# Patient Record
Sex: Male | Born: 1985 | Race: White | Hispanic: No | Marital: Single | Smoking: Current every day smoker
Health system: Southern US, Community
[De-identification: ages and names within clinical notes are randomized; demographics above are authoritative.]

## PROBLEM LIST (undated history)

## (undated) DIAGNOSIS — J45909 Unspecified asthma, uncomplicated: Secondary | ICD-10-CM

## (undated) HISTORY — PX: TONSILLECTOMY: SUR1361

---

## 2007-07-17 ENCOUNTER — Ambulatory Visit: Payer: Self-pay | Admitting: Internal Medicine

## 2007-12-03 ENCOUNTER — Emergency Department: Payer: Self-pay | Admitting: Emergency Medicine

## 2008-04-07 ENCOUNTER — Emergency Department: Payer: Self-pay | Admitting: Emergency Medicine

## 2008-06-09 ENCOUNTER — Emergency Department: Payer: Self-pay | Admitting: Emergency Medicine

## 2008-06-19 ENCOUNTER — Emergency Department: Payer: Self-pay

## 2009-11-05 ENCOUNTER — Emergency Department: Payer: Self-pay

## 2010-01-10 ENCOUNTER — Emergency Department: Payer: Self-pay | Admitting: Emergency Medicine

## 2010-05-18 ENCOUNTER — Emergency Department: Payer: Self-pay | Admitting: Unknown Physician Specialty

## 2011-01-24 ENCOUNTER — Emergency Department: Payer: Self-pay | Admitting: Emergency Medicine

## 2011-02-07 ENCOUNTER — Ambulatory Visit: Payer: Self-pay | Admitting: Internal Medicine

## 2011-02-09 ENCOUNTER — Ambulatory Visit: Payer: Self-pay | Admitting: Family Medicine

## 2013-04-05 ENCOUNTER — Emergency Department: Payer: Self-pay | Admitting: Emergency Medicine

## 2013-07-20 ENCOUNTER — Emergency Department: Payer: Self-pay | Admitting: Emergency Medicine

## 2013-07-21 LAB — CBC
HCT: 44.7 % (ref 40.0–52.0)
HGB: 14.9 g/dL (ref 13.0–18.0)
MCH: 30.9 pg (ref 26.0–34.0)
MCHC: 33.4 g/dL (ref 32.0–36.0)
MCV: 93 fL (ref 80–100)

## 2013-07-21 LAB — COMPREHENSIVE METABOLIC PANEL
Albumin: 4 g/dL (ref 3.4–5.0)
Alkaline Phosphatase: 57 U/L
Anion Gap: 4 — ABNORMAL LOW (ref 7–16)
Bilirubin,Total: 0.3 mg/dL (ref 0.2–1.0)
Chloride: 106 mmol/L (ref 98–107)
Co2: 29 mmol/L (ref 21–32)
Creatinine: 1.09 mg/dL (ref 0.60–1.30)
EGFR (African American): >60
EGFR (Non-African Amer.): >60
Potassium: 3.9 mmol/L (ref 3.5–5.1)
Total Protein: 7.7 g/dL (ref 6.4–8.2)

## 2013-07-21 LAB — LIPASE, BLOOD: Lipase: 69 U/L — ABNORMAL LOW (ref 73–393)

## 2013-10-17 ENCOUNTER — Emergency Department: Payer: Self-pay | Admitting: Emergency Medicine
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
## 2017-01-12 ENCOUNTER — Emergency Department
Admission: EM | Admit: 2017-01-12 | Discharge: 2017-01-13 | Disposition: A | Discharge status: Home / Self Care | Payer: Self-pay | Attending: Emergency Medicine | Admitting: Emergency Medicine

## 2017-01-12 ENCOUNTER — Emergency Department: Payer: Self-pay

## 2017-01-12 DIAGNOSIS — Z23 Encounter for immunization: Secondary | ICD-10-CM | POA: Insufficient documentation

## 2017-01-12 DIAGNOSIS — S61309A Unspecified open wound of unspecified finger with damage to nail, initial encounter: Secondary | ICD-10-CM

## 2017-01-12 DIAGNOSIS — S61303A Unspecified open wound of left middle finger with damage to nail, initial encounter: Secondary | ICD-10-CM | POA: Insufficient documentation

## 2017-01-12 DIAGNOSIS — Y9389 Activity, other specified: Secondary | ICD-10-CM | POA: Insufficient documentation

## 2017-01-12 DIAGNOSIS — Y998 Other external cause status: Secondary | ICD-10-CM | POA: Insufficient documentation

## 2017-01-12 DIAGNOSIS — S61301A Unspecified open wound of left index finger with damage to nail, initial encounter: Secondary | ICD-10-CM | POA: Insufficient documentation

## 2017-01-12 DIAGNOSIS — Z79899 Other long term (current) drug therapy: Secondary | ICD-10-CM | POA: Insufficient documentation

## 2017-01-12 DIAGNOSIS — Y92099 Unspecified place in other non-institutional residence as the place of occurrence of the external cause: Secondary | ICD-10-CM | POA: Insufficient documentation

## 2017-01-12 DIAGNOSIS — S6992XA Unspecified injury of left wrist, hand and finger(s), initial encounter: Secondary | ICD-10-CM

## 2017-01-12 DIAGNOSIS — W230XXA Caught, crushed, jammed, or pinched between moving objects, initial encounter: Secondary | ICD-10-CM | POA: Insufficient documentation

## 2017-01-12 LAB — CBC WITH DIFFERENTIAL/PLATELET
BASOS ABS: 0.1 10*3/uL (ref 0–0.1)
Basophils Relative: 1 %
Eosinophils Absolute: 0.4 10*3/uL (ref 0–0.7)
Eosinophils Relative: 5 %
HEMATOCRIT: 46.9 % (ref 40.0–52.0)
Hemoglobin: 16 g/dL (ref 13.0–18.0)
LYMPHS PCT: 33 %
Lymphs Abs: 2.5 10*3/uL (ref 1.0–3.6)
MCH: 31.5 pg (ref 26.0–34.0)
MCHC: 34.2 g/dL (ref 32.0–36.0)
MCV: 92.2 fL (ref 80.0–100.0)
MONO ABS: 0.6 10*3/uL (ref 0.2–1.0)
Monocytes Relative: 8 %
NEUTROS ABS: 4.1 10*3/uL (ref 1.4–6.5)
Neutrophils Relative %: 53 %
PLATELETS: 244 10*3/uL (ref 150–440)
RBC: 5.09 MIL/uL (ref 4.40–5.90)
RDW: 13.2 % (ref 11.5–14.5)
WBC: 7.7 10*3/uL (ref 3.8–10.6)

## 2017-01-12 LAB — BASIC METABOLIC PANEL
ANION GAP: 8 (ref 5–15)
BUN: 13 mg/dL (ref 6–20)
CALCIUM: 9.4 mg/dL (ref 8.9–10.3)
CO2: 26 mmol/L (ref 22–32)
Chloride: 105 mmol/L (ref 101–111)
Creatinine, Ser: 1.11 mg/dL (ref 0.61–1.24)
GLUCOSE: 121 mg/dL — AB (ref 65–99)
Potassium: 3.9 mmol/L (ref 3.5–5.1)
Sodium: 139 mmol/L (ref 135–145)

## 2017-01-12 MED ORDER — LIDOCAINE HCL (PF) 1 % IJ SOLN
INTRAMUSCULAR | Status: AC
  Administered 2017-01-12: 10 mL via INTRADERMAL
  Filled 2017-01-12: qty 10

## 2017-01-12 MED ORDER — HYDROMORPHONE HCL 1 MG/ML IJ SOLN
1.0000 mg | Freq: Once | INTRAMUSCULAR | Status: AC
  Administered 2017-01-13: 1 mg via INTRAMUSCULAR
  Filled 2017-01-12: qty 1

## 2017-01-12 MED ORDER — LIDOCAINE HCL (PF) 1 % IJ SOLN
INTRAMUSCULAR | Status: AC
  Filled 2017-01-12: qty 5

## 2017-01-12 MED ORDER — OXYCODONE-ACETAMINOPHEN 5-325 MG PO TABS
1.0000 | ORAL_TABLET | Freq: Once | ORAL | Status: AC
  Administered 2017-01-12: 1 via ORAL

## 2017-01-12 MED ORDER — CEPHALEXIN 500 MG PO CAPS: 500.0000 mg | ORAL_CAPSULE | Freq: Four times a day (QID) | ORAL | 0 refills | Status: AC

## 2017-01-12 MED ORDER — HYDROMORPHONE HCL 1 MG/ML IJ SOLN
INTRAMUSCULAR | Status: AC
  Administered 2017-01-12: 1 mg
  Filled 2017-01-12: qty 1

## 2017-01-12 MED ORDER — TETANUS-DIPHTH-ACELL PERTUSSIS 5-2.5-18.5 LF-MCG/0.5 IM SUSP
0.5000 mL | Freq: Once | INTRAMUSCULAR | Status: AC
  Administered 2017-01-12: 0.5 mL via INTRAMUSCULAR
  Filled 2017-01-12: qty 0.5

## 2017-01-12 MED ORDER — LIDOCAINE HCL (PF) 1 % IJ SOLN
10.0000 mL | Freq: Once | INTRAMUSCULAR | Status: AC
  Administered 2017-01-12: 10 mL via INTRADERMAL

## 2017-01-12 MED ORDER — LIDOCAINE HCL (PF) 1 % IJ SOLN
INTRAMUSCULAR | Status: AC
  Filled 2017-01-12: qty 10

## 2017-01-12 MED ORDER — OXYCODONE-ACETAMINOPHEN 5-325 MG PO TABS
ORAL_TABLET | ORAL | Status: AC
  Administered 2017-01-12: 1 via ORAL
  Filled 2017-01-12: qty 1

## 2017-01-12 MED ORDER — HYDROMORPHONE HCL 1 MG/ML IJ SOLN
1.0000 mg | Freq: Once | INTRAMUSCULAR | Status: AC
  Administered 2017-01-12: 1 mg via INTRAVENOUS
  Filled 2017-01-12: qty 1

## 2017-01-12 MED ORDER — ONDANSETRON HCL 4 MG/2ML IJ SOLN
INTRAMUSCULAR | Status: AC
  Administered 2017-01-12: 4 mg
  Filled 2017-01-12: qty 2

## 2017-01-12 MED ORDER — LIDOCAINE HCL (PF) 1 % IJ SOLN
INTRAMUSCULAR | Status: AC
  Administered 2017-01-13: 20 mL via INTRADERMAL
  Filled 2017-01-12: qty 5

## 2017-01-12 MED ORDER — CEPHALEXIN 500 MG PO CAPS
500.0000 mg | ORAL_CAPSULE | Freq: Once | ORAL | Status: AC
  Administered 2017-01-12: 500 mg via ORAL
  Filled 2017-01-12: qty 1
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
## 2017-01-13 MED ORDER — LIDOCAINE HCL (PF) 1 % IJ SOLN
20.0000 mL | Freq: Once | INTRAMUSCULAR | Status: AC
  Administered 2017-01-13: 20 mL via INTRADERMAL

## 2017-01-13 MED ORDER — OXYCODONE-ACETAMINOPHEN 5-325 MG PO TABS
1.0000 | ORAL_TABLET | Freq: Once | ORAL | Status: AC
  Administered 2017-01-13: 1 via ORAL

## 2017-01-13 MED ORDER — BACITRACIN ZINC 500 UNIT/GM EX OINT
TOPICAL_OINTMENT | CUTANEOUS | Status: AC
  Administered 2017-01-13: 1 via TOPICAL
  Filled 2017-01-13: qty 2.7

## 2017-01-13 MED ORDER — BACITRACIN ZINC 500 UNIT/GM EX OINT
TOPICAL_OINTMENT | CUTANEOUS | Status: DC | PRN
  Administered 2017-01-13: 1 via TOPICAL

## 2017-01-13 MED ORDER — OXYCODONE-ACETAMINOPHEN 5-325 MG PO TABS
ORAL_TABLET | ORAL | Status: AC
  Administered 2017-01-13: 1 via ORAL
  Filled 2017-01-13: qty 1

## 2017-01-13 MED ORDER — OXYCODONE-ACETAMINOPHEN 5-325 MG PO TABS: 1.0000 | ORAL_TABLET | ORAL | 0 refills | Status: DC | PRN

## 2017-01-13 NOTE — ED Notes (Addendum)
Pt given visual, written and verbal instructions for wound care. Pt and pt's significant other verbalized understanding of this.  Pt's wound cleaned by this RN and Mayra, EDT with dressing applied by Penni BombardKendall RN with bacitracin per EDP order.  Pt and pt's significant other verbalizes understanding of DC instructions including medication and follow up.  Pt and significant other also verbalize understanding of signs of infection to wound with repeat back to this RN including drainage, odor, temperature, heat to wound site, increased redness around site and increased pain. Pt ambulatory at discharge and is A&O at this time. Pt discharged with significant other driving home.  Patient discharge and follow up information reviewed with patient by ED nursing staff and patient given the opportunity to ask questions pertaining to ED visit and discharge plan of care. Patient advised that should symptoms not continue to improve, resolve entirely, or should new symptoms develop then a follow up visit with their PCP or a return visit to the ED may be warranted. Patient verbalized consent and understanding of discharge plan of care including potential need for further evaluation. Patient being discharged in stable condition per attending ED physician on duty.

## 2017-01-13 NOTE — ED Provider Notes (Signed)
We were unable to find foil to cover the nail bed. Dr. Mayford KnifeWilliams recommended using Steri-Strips to place under the nail matrix. Absorbable sutures were placed into the nail bed and Steri-Strips were sutured into the nail matrix.  LACERATION REPAIR Performed by: Enid DerryAshley Jaelene Garciagarcia  Consent: Verbal consent obtained.  Consent given by: patient  Prepped and Draped in normal sterile fashion  Wound explored: No foreign bodies   Laceration Location: Index finger  Anesthesia: None  Local anesthetic: lidocaine 1% without epinephrine  Anesthetic total: 10 ml  Irrigation method: syringe  Amount of cleaning: 500ml normal saline  Skin closure: 5-0 Vicryl absorbable   Number of sutures: 7  Technique: Simple interrupted  Patient tolerance: Patient tolerated the procedure well with no immediate complications.   LACERATION REPAIR Performed by: Enid DerryAshley Baker Moronta  Consent: Verbal consent obtained.  Consent given by: patient  Prepped and Draped in normal sterile fashion  Wound explored: No foreign bodies   Laceration Location: middle finger  Anesthesia: None  Local anesthetic: lidocaine 1% without epinephrine  Anesthetic total: 10 ml  Irrigation method: syringe  Amount of cleaning: 500ml normal saline  Skin closure: 4-0 nylon and 5-0 Vicryl absorbable   Number of sutures: 12  Technique: Simple interrupted  Patient tolerance: Patient tolerated the procedure well with no immediate complications.   Enid DerryWagner, Lattie Cervi, PA-C 01/13/17 0031

## 2017-01-17 ENCOUNTER — Emergency Department
Admission: EM | Admit: 2017-01-17 | Discharge: 2017-01-17 | Disposition: A | Discharge status: Home / Self Care | Payer: Self-pay | Attending: Emergency Medicine | Admitting: Emergency Medicine

## 2017-01-17 ENCOUNTER — Encounter: Payer: Self-pay | Admitting: Emergency Medicine

## 2017-01-17 DIAGNOSIS — Z760 Encounter for issue of repeat prescription: Secondary | ICD-10-CM | POA: Insufficient documentation

## 2017-01-17 DIAGNOSIS — J45909 Unspecified asthma, uncomplicated: Secondary | ICD-10-CM | POA: Insufficient documentation

## 2017-01-17 DIAGNOSIS — Z5189 Encounter for other specified aftercare: Secondary | ICD-10-CM

## 2017-01-17 DIAGNOSIS — Z4800 Encounter for change or removal of nonsurgical wound dressing: Secondary | ICD-10-CM | POA: Insufficient documentation

## 2017-01-17 DIAGNOSIS — M79642 Pain in left hand: Secondary | ICD-10-CM | POA: Insufficient documentation

## 2017-01-17 HISTORY — DX: Unspecified asthma, uncomplicated: J45.909

## 2017-01-17 MED ORDER — OXYCODONE-ACETAMINOPHEN 5-325 MG PO TABS: 1.0000 | ORAL_TABLET | ORAL | 0 refills | Status: DC | PRN

## 2017-01-17 MED ORDER — NAPROXEN 500 MG PO TABS: 500.0000 mg | ORAL_TABLET | Freq: Two times a day (BID) | ORAL | 0 refills | Status: DC

## 2017-01-17 MED ORDER — HYDROMORPHONE HCL 1 MG/ML IJ SOLN
1.0000 mg | Freq: Once | INTRAMUSCULAR | Status: AC
  Administered 2017-01-17: 1 mg via INTRAMUSCULAR
  Filled 2017-01-17: qty 1
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
## 2017-01-17 NOTE — ED Notes (Signed)
See triage note  Wound check to finger injury  Is currently out of pain meds

## 2017-02-09 ENCOUNTER — Ambulatory Visit: Payer: Self-pay | Admitting: Surgery

## 2017-02-20 ENCOUNTER — Encounter: Payer: Self-pay | Attending: Surgery | Admitting: Surgery

## 2017-02-20 DIAGNOSIS — S61213A Laceration without foreign body of left middle finger without damage to nail, initial encounter: Secondary | ICD-10-CM | POA: Insufficient documentation

## 2017-02-20 DIAGNOSIS — J45909 Unspecified asthma, uncomplicated: Secondary | ICD-10-CM | POA: Insufficient documentation

## 2017-02-20 DIAGNOSIS — X58XXXA Exposure to other specified factors, initial encounter: Secondary | ICD-10-CM | POA: Insufficient documentation

## 2017-02-20 DIAGNOSIS — S61211A Laceration without foreign body of left index finger without damage to nail, initial encounter: Secondary | ICD-10-CM | POA: Insufficient documentation

## 2017-02-21 NOTE — Progress Notes (Signed)
Trisha MangleCRUTCHFIELD, Naftali L. ([id number]) Visit Report for 02/20/2017 Abuse/Suicide Risk Screen Details Patient Name: Roussel, Ashtin L. Date of Service: 02/20/2017 8:00 AM Medical Record Number: [id number] Patient Account Number: [id number] Date of Birth/Sex: 06/23/1986 (30 y.o. Male) Treating RN: Curtis Sitesorthy, Joanna Primary Care Vann Okerlund: PATIENT, NO Other Clinician: Referring Natilee Gauer: Phineas SemenGoodman, Graydon Treating Rachna Schonberger/Extender: Rudene ReBritto, Errol Weeks in Treatment: 0 Abuse/Suicide Risk Screen Items Answer ABUSE/SUICIDE RISK SCREEN: Has anyone close to you tried to hurt or harm you recentlyo No Do you feel uncomfortable with anyone in your familyo No Has anyone forced you do things that you didnot want to doo No Do you have any thoughts of harming yourselfo No Patient displays signs or symptoms of abuse and/or neglect. No Electronic Signature(s) Signed: 02/20/2017 4:55:34 PM By: Curtis Sitesorthy, Joanna Entered By: Curtis Sitesorthy, Joanna on 02/20/2017 08:17:25 Knippenberg, Llana AlimentJAMES L. ([id number]) -------------------------------------------------------------------------------- Activities of Daily Living Details Patient Name: Pae, Nitin L. Date of Service: 02/20/2017 8:00 AM Medical Record Number: [id number] Patient Account Number: [id number] Date of Birth/Sex: 06/05/1986 (30 y.o. Male) Treating RN: Curtis Sitesorthy, Joanna Primary Care Lovie Zarling: PATIENT, NO Other Clinician: Referring Soliana Kitko: Phineas SemenGoodman, Graydon Treating Ayah Cozzolino/Extender: Rudene ReBritto, Errol Weeks in Treatment: 0 Activities of Daily Living Items Answer Activities of Daily Living (Please select one for each item) Drive Automobile Completely Able Take Medications Completely Able Use Telephone Completely Able Care for Appearance Completely Able Use Toilet Completely Able Bath / Shower Completely Able Dress Self Completely Able Feed Self Completely Able Walk Completely Able Get In / Out Bed Completely Able Housework Completely Able Prepare Meals  Completely Able Handle Money Completely Able Shop for Self Completely Able Electronic Signature(s) Signed: 02/20/2017 4:55:34 PM By: Curtis Sitesorthy, Joanna Entered By: Curtis Sitesorthy, Joanna on 02/20/2017 08:17:42 Saintil, Llana AlimentJAMES L. ([id number]) -------------------------------------------------------------------------------- Education Assessment Details Patient Name: Zacher, Cartez L. Date of Service: 02/20/2017 8:00 AM Medical Record Number: [id number] Patient Account Number: [id number] Date of Birth/Sex: 01/08/1986 (30 y.o. Male) Treating RN: Curtis Sitesorthy, Joanna Primary Care Omie Ferger: PATIENT, NO Other Clinician: Referring Noelene Gang: Phineas SemenGoodman, Graydon Treating Flower Franko/Extender: Rudene ReBritto, Errol Weeks in Treatment: 0 Primary Learner Assessed: Patient Learning Preferences/Education Level/Primary Language Learning Preference: Explanation, Demonstration Highest Education Level: High School Preferred Language: English Cognitive Barrier Assessment/Beliefs Language Barrier: No Translator Needed: No Memory Deficit: No Emotional Barrier: No Cultural/Religious Beliefs Affecting Medical No Care: Physical Barrier Assessment Impaired Vision: No Impaired Hearing: No Decreased Hand dexterity: No Knowledge/Comprehension Assessment Knowledge Level: Medium Comprehension Level: Medium Ability to understand written Medium instructions: Ability to understand verbal Medium instructions: Motivation Assessment Anxiety Level: Calm Cooperation: Cooperative Education Importance: Acknowledges Need Interest in Health Problems: Asks Questions Perception: Coherent Willingness to Engage in Self- Medium Management Activities: Readiness to Engage in Self- Medium Management Activities: Electronic Signature(s) Trisha MangleCRUTCHFIELD, Remus L. ([id number]) Signed: 02/20/2017 4:55:34 PM By: Curtis Sitesorthy, Joanna Entered By: Curtis Sitesorthy, Joanna on 02/20/2017 08:18:04 Gamarra, Llana AlimentJAMES L.  ([id number]) -------------------------------------------------------------------------------- Fall Risk Assessment Details Patient Name: Boateng, Taimur L. Date of Service: 02/20/2017 8:00 AM Medical Record Number: [id number] Patient Account Number: [id number] Date of Birth/Sex: 07/31/1985 (30 y.o. Male) Treating RN: Curtis Sitesorthy, Joanna Primary Care Thedora Rings: PATIENT, NO Other Clinician: Referring Ellwyn Ergle: Phineas SemenGoodman, Graydon Treating Synda Bagent/Extender: Rudene ReBritto, Errol Weeks in Treatment: 0 Fall Risk Assessment Items Have you had 2 or more falls in the last 12 monthso 0 No Have you had any fall that resulted in injury in the last 12 monthso 0 No FALL RISK ASSESSMENT: History of falling - immediate or within 3 months 0 No Secondary diagnosis 0 No Ambulatory aid None/bed rest/wheelchair/nurse 0 Yes Crutches/cane/walker 0 No Furniture 0  No IV Access/Saline Lock 0 No Gait/Training Normal/bed rest/immobile 0 Yes Weak 0 No Impaired 0 No Mental Status Oriented to own ability 0 Yes Electronic Signature(s) Signed: 02/20/2017 4:55:34 PM By: Curtis Sitesorthy, Joanna Entered By: Curtis Sitesorthy, Joanna on 02/20/2017 08:18:12 Pollino, Llana AlimentJAMES L. ([id number]) -------------------------------------------------------------------------------- Nutrition Risk Assessment Details Patient Name: Kossman, Ruhaan L. Date of Service: 02/20/2017 8:00 AM Medical Record Number: [id number] Patient Account Number: [id number] Date of Birth/Sex: 03/05/1986 (30 y.o. Male) Treating RN: Curtis Sitesorthy, Joanna Primary Care Jenene Kauffmann: PATIENT, NO Other Clinician: Referring Carly Sabo: Phineas SemenGoodman, Graydon Treating Kamerin Grumbine/Extender: Rudene ReBritto, Errol Weeks in Treatment: 0 Height (in): 72 Weight (lbs): 150 Body Mass Index (BMI): 20.3 Nutrition Risk Assessment Items NUTRITION RISK SCREEN: I have an illness or condition that made me change the kind and/or 0 No amount of food I eat I eat fewer than two meals per day 0 No I eat few fruits and  vegetables, or milk products 0 No I have three or more drinks of beer, liquor or wine almost every day 0 No I have tooth or mouth problems that make it hard for me to eat 0 No I don't always have enough money to buy the food I need 0 No I eat alone most of the time 0 No I take three or more different prescribed or over-the-counter drugs a 0 No day Without wanting to, I have lost or gained 10 pounds in the last six 0 No months I am not always physically able to shop, cook and/or feed myself 0 No Nutrition Protocols Good Risk Protocol 0 No interventions needed Moderate Risk Protocol Electronic Signature(s) Signed: 02/20/2017 4:55:34 PM By: Curtis Sitesorthy, Joanna Entered By: Curtis Sitesorthy, Joanna on 02/20/2017 08:18:16

## 2017-02-21 NOTE — Progress Notes (Signed)
DESTINY, TRICKEY ([id number]) Visit Report for 02/20/2017 Allergy List Details Patient Name: [name], [name] Date of Service: 02/20/2017 8:00 AM Medical Record Number: [id number] Patient Account Number: [id number] Date of Birth/Sex: 12-15-1985 (30 y.o. Male) Treating RN: Curtis Sites Primary Care Tarek Cravens: PATIENT, NO Other Clinician: Referring Felicitas Sine: Phineas Semen Treating Danaisha Celli/Extender: Rudene Re in Treatment: 0 Allergies Active Allergies No Known Drug Allergies Allergy Notes Electronic Signature(s) Signed: 02/20/2017 4:55:34 PM By: Curtis Sites Entered By: Curtis Sites on 02/20/2017 08:15:01 [name], [name] ([id number]) -------------------------------------------------------------------------------- Arrival Information Details Patient Name: [name], [name] Date of Service: 02/20/2017 8:00 AM Medical Record Number: [id number] Patient Account Number: [id number] Date of Birth/Sex: [birth year] (30 y.o. Male) Treating RN: Curtis Sites Primary Care Jaxson Anglin: PATIENT, NO Other Clinician: Referring Jaylynn Mcaleer: Phineas Semen Treating Tabitha Riggins/Extender: Rudene Re in Treatment: 0 Visit Information Patient Arrived: Ambulatory Arrival Time: 08:12 Accompanied By: self Transfer Assistance: None Patient Identification Verified: Yes Secondary Verification Process Yes Completed: Electronic Signature(s) Signed: 02/20/2017 4:55:34 PM By: Curtis Sites Entered By: Curtis Sites on 02/20/2017 08:12:19 [name], [name] ([id number]) -------------------------------------------------------------------------------- Clinic Level of Care Assessment Details Patient Name: [name], [name] Date of Service: 02/20/2017 8:00 AM Medical Record Number: [id number] Patient Account Number: [id number] Date of Birth/Sex: 1985-12-14 (30 y.o. Male) Treating RN: Curtis Sites Primary Care Lesle Faron: PATIENT, NO Other Clinician: Referring Amish Mintzer:  Phineas Semen Treating Kyndell Zeiser/Extender: Rudene Re in Treatment: 0 Clinic Level of Care Assessment Items TOOL 1 Quantity Score []  - Use when EandM and Procedure is performed on INITIAL visit 0 ASSESSMENTS - Nursing Assessment / Reassessment X - General Physical Exam (combine w/ comprehensive assessment (listed just 1 20 below) when performed on new pt. evals) X - Comprehensive Assessment (HX, ROS, Risk Assessments, Wounds Hx, etc.) 1 25 ASSESSMENTS - Wound and Skin Assessment / Reassessment X - Dermatologic / Skin Assessment (not related to wound area) 1 10 ASSESSMENTS - Ostomy and/or Continence Assessment and Care []  - Incontinence Assessment and Management 0 []  - Ostomy Care Assessment and Management (repouching, etc.) 0 PROCESS - Coordination of Care X - Simple Patient / Family Education for ongoing care 1 15 []  - Complex (extensive) Patient / Family Education for ongoing care 0 X - Staff obtains Chiropractor, Records, Test Results / Process Orders 1 10 []  - Staff telephones HHA, Nursing Homes / Clarify orders / etc 0 []  - Routine Transfer to another Facility (non-emergent condition) 0 []  - Routine Hospital Admission (non-emergent condition) 0 X - New Admissions / Manufacturing engineer / Ordering NPWT, Apligraf, etc. 1 15 []  - Emergency Hospital Admission (emergent condition) 0 PROCESS - Special Needs []  - Pediatric / Minor Patient Management 0 []  - Isolation Patient Management 0 [name], [name] ([id number]) []  - Hearing / Language / Visual special needs 0 []  - Assessment of Community assistance (transportation, D/C planning, etc.) 0 []  - Additional assistance / Altered mentation 0 []  - Support Surface(s) Assessment (bed, cushion, seat, etc.) 0 INTERVENTIONS - Miscellaneous []  - External ear exam 0 []  - Patient Transfer (multiple staff / Nurse, adult / Similar devices) 0 []  - Simple Staple / Suture removal (25 or less) 0 []  - Complex Staple / Suture removal (26  or more) 0 []  - Hypo/Hyperglycemic Management (do not check if billed separately) 0 []  - Ankle / Brachial Index (ABI) - do not check if billed separately 0 Has the patient been seen at the hospital within the last three years: Yes Total Score: 95 Level Of Care: New/Established - Level  3 Electronic Signature(s) Signed: 02/20/2017 4:55:34 PM By: Curtis Sitesorthy, Joanna Entered By: Curtis Sitesorthy, Joanna on 02/20/2017 09:54:20 [name], [name] ([id number]) -------------------------------------------------------------------------------- Encounter Discharge Information Details Patient Name: [name], [name] Date of Service: 02/20/2017 8:00 AM Medical Record Number: [id number] Patient Account Number: [id number] Date of Birth/Sex: 06/08/1986 (30 y.o. Male) Treating RN: Curtis Sitesorthy, Joanna Primary Care Jayden Kratochvil: PATIENT, NO Other Clinician: Referring Temeka Pore: Phineas SemenGoodman, Graydon Treating Laken Rog/Extender: Rudene ReBritto, Errol Weeks in Treatment: 0 Encounter Discharge Information Items Discharge Pain Level: 0 Discharge Condition: Stable Ambulatory Status: Ambulatory Discharge Destination: Home Transportation: Private Auto Accompanied By: self Schedule Follow-up Appointment: No Medication Reconciliation completed and provided to Patient/Care No Chelby Salata: Provided on Clinical Summary of Care: 02/20/2017 Form Type Recipient Paper Patient JC Electronic Signature(s) Signed: 02/20/2017 10:33:52 AM By: Curtis Sitesorthy, Joanna Previous Signature: 02/20/2017 8:49:45 AM Version By: Gwenlyn PerkingMoore, Shelia Entered By: Curtis Sitesorthy, Joanna on 02/20/2017 10:33:52 [name], [name] ([id number]) -------------------------------------------------------------------------------- Multi Wound Chart Details Patient Name: [name], [name] Date of Service: 02/20/2017 8:00 AM Medical Record Number: [id number] Patient Account Number: [id number] Date of Birth/Sex: 09/25/1985 (30 y.o. Male) Treating RN: Curtis Sitesorthy, Joanna Primary Care Edis Huish: PATIENT,  NO Other Clinician: Referring Jaylanie Boschee: Phineas SemenGoodman, Graydon Treating Jaelan Rasheed/Extender: Rudene ReBritto, Errol Weeks in Treatment: 0 Vital Signs Height(in): 72 Pulse(bpm): 59 Weight(lbs): 150 Blood Pressure 124/77 (mmHg): Body Mass Index(BMI): 20 Temperature(F): 98.5 Respiratory Rate 18 (breaths/min): Photos: [1:No Photos] [N/A:N/A] Wound Location: [1:Left Hand - 3rd Digit] [N/A:N/A] Wounding Event: [1:Trauma] [N/A:N/A] Primary Etiology: [1:Trauma, Other] [N/A:N/A] Comorbid History: [1:Asthma] [N/A:N/A] Date Acquired: [1:01/17/2017] [N/A:N/A] Weeks of Treatment: [1:0] [N/A:N/A] Wound Status: [1:Open] [N/A:N/A] Measurements L x W x D 0.1x0.1x0.1 [N/A:N/A] (cm) Area (cm) : [1:0.008] [N/A:N/A] Volume (cm) : [1:0.001] [N/A:N/A] Classification: [1:Full Thickness Without Exposed Support Structures] [N/A:N/A] Exudate Amount: [1:Large] [N/A:N/A] Exudate Type: [1:Serous] [N/A:N/A] Exudate Color: [1:amber] [N/A:N/A] Wound Margin: [1:Flat and Intact] [N/A:N/A] Granulation Amount: [1:None Present (0%)] [N/A:N/A] Necrotic Amount: [1:Large (67-100%)] [N/A:N/A] Necrotic Tissue: [1:Eschar] [N/A:N/A] Exposed Structures: [1:Fascia: No Fat Layer (Subcutaneous Tissue) Exposed: No Tendon: No Muscle: No Joint: No Bone: No] [N/A:N/A] Epithelialization: [1:None] [N/A:N/A] Debridement: Open Wound/Selective N/A N/A ([id number]) - Selective Pre-procedure 08:41 N/A N/A Verification/Time Out Taken: Pain Control: Lidocaine 4% Topical N/A N/A Solution Tissue Debrided: Necrotic/Eschar, Other, N/A N/A Skin Level: Non-Viable Tissue N/A N/A Debridement Area (sq 0.01 N/A N/A cm): Instrument: Forceps, Scissors N/A N/A Post Debridement 0.1x0.1x0.1 N/A N/A Measurements L x W x D (cm) Post Debridement 0.001 N/A N/A Volume: (cm) Periwound Skin Texture: Excoriation: No N/A N/A Induration: No Callus: No Crepitus: No Rash: No Scarring: No Periwound Skin Maceration: No N/A N/A Moisture: Dry/Scaly:  No Periwound Skin Color: Atrophie Blanche: No N/A N/A Cyanosis: No Ecchymosis: No Erythema: No Hemosiderin Staining: No Mottled: No Pallor: No Rubor: No Tenderness on Yes N/A N/A Palpation: Wound Preparation: Ulcer Cleansing: N/A N/A Rinsed/Irrigated with Saline Topical Anesthetic Applied: Other: lidocaine 4% Procedures Performed: Debridement N/A N/A Treatment Notes Electronic Signature(s) Signed: 02/20/2017 9:10:53 AM By: Evlyn KannerBritto, Errol MD, FACS Steff[place], CumberlandJAMES L. ([id number]) Entered By: Evlyn KannerBritto, Errol on 02/20/2017 09:10:53 Celaya, [name] ([id number]) -------------------------------------------------------------------------------- Pain Assessment Details Patient Name: [name], [name] Date of Service: 02/20/2017 8:00 AM Medical Record Number: [id number] Patient Account Number: [id number] Date of Birth/Sex: 01/10/1986 (30 y.o. Male) Treating RN: Curtis Sitesorthy, Joanna Primary Care Lenny Fiumara: PATIENT, NO Other Clinician: Referring Locklan Canoy: Phineas SemenGoodman, Graydon Treating Maurice Fotheringham/Extender: Rudene ReBritto, Errol Weeks in Treatment: 0 Active Problems Location of Pain Severity and Description of Pain Patient Has Paino Yes Site Locations Pain Location: Pain in Ulcers With Dressing Change: Yes Duration of  the Pain. Constant / Intermittento Constant Pain Management and Medication Current Pain Management: Notes Topical or injectable lidocaine is offered to patient for acute pain when surgical debridement is performed. If needed, Patient is instructed to use over the counter pain medication for the following 24-48 hours after debridement. Wound care MDs do not prescribed pain medications. Patient has chronic pain or uncontrolled pain. Patient has been instructed to make an appointment with their Primary Care Physician for pain management. Electronic Signature(s) Signed: 02/20/2017 4:55:34 PM By: Curtis Sites Entered By: Curtis Sites on 02/20/2017 08:12:32 [name], [name]  ([id number]) -------------------------------------------------------------------------------- Patient/Caregiver Education Details Patient Name: [name], [name] Date of Service: 02/20/2017 8:00 AM Medical Record Number: [id number] Patient Account Number: [id number] Date of Birth/Gender: [birth year] (30 y.o. Male) Treating RN: Curtis Sites Primary Care Physician: PATIENT, NO Other Clinician: Referring Physician: Phineas Semen Treating Physician/Extender: Rudene Re in Treatment: 0 Education Assessment Education Provided To: Patient Education Topics Provided Basic Hygiene: Handouts: Other: care of newly healed ulcer sites Methods: Explain/Verbal Responses: State content correctly Electronic Signature(s) Signed: 02/20/2017 4:55:34 PM By: Curtis Sites Entered By: Curtis Sites on 02/20/2017 10:34:13 [name], [name] ([id number]) -------------------------------------------------------------------------------- Wound Assessment Details Patient Name: [name], [name] Date of Service: 02/20/2017 8:00 AM Medical Record Number: [id number] Patient Account Number: [id number] Date of Birth/Sex: 1986-04-10 (30 y.o. Male) Treating RN: Curtis Sites Primary Care Adelaido Nicklaus: PATIENT, NO Other Clinician: Referring Idrees Quam: Phineas Semen Treating Shamicka Inga/Extender: Rudene Re in Treatment: 0 Wound Status Wound Number: 1 Primary Etiology: Trauma, Other Wound Location: Left Hand - 3rd Digit Wound Status: Open Wounding Event: Trauma Comorbid History: Asthma Date Acquired: 01/17/2017 Weeks Of Treatment: 0 Clustered Wound: No Photos Photo Uploaded By: Curtis Sites on 02/20/2017 10:59:11 Wound Measurements Length: (cm) 0.1 Width: (cm) 0.1 Depth: (cm) 0.1 Area: (cm) 0.008 Volume: (cm) 0.001 % Reduction in Area: % Reduction in Volume: Epithelialization: None Tunneling: No Undermining: No Wound Description Full Thickness Without Exposed Foul  Odor After Classification: Support Structures Slough/Fibrino Wound Margin: Flat and Intact Exudate Large Amount: Exudate Type: Serous Exudate Color: amber Cleansing: No Yes Wound Bed Granulation Amount: None Present (0%) Exposed Structure Necrotic Amount: Large (67-100%) Fascia Exposed: No Necrotic Quality: Eschar Fat Layer (Subcutaneous Tissue) Exposed: No [name], Trevyon L. ([id number]) Tendon Exposed: No Muscle Exposed: No Joint Exposed: No Bone Exposed: No Periwound Skin Texture Texture Color No Abnormalities Noted: No No Abnormalities Noted: No Callus: No Atrophie Blanche: No Crepitus: No Cyanosis: No Excoriation: No Ecchymosis: No Induration: No Erythema: No Rash: No Hemosiderin Staining: No Scarring: No Mottled: No Pallor: No Moisture Rubor: No No Abnormalities Noted: No Dry / Scaly: No Temperature / Pain Maceration: No Tenderness on Palpation: Yes Wound Preparation Ulcer Cleansing: Rinsed/Irrigated with Saline Topical Anesthetic Applied: Other: lidocaine 4%, Electronic Signature(s) Signed: 02/20/2017 4:55:34 PM By: Curtis Sites Entered By: Curtis Sites on 02/20/2017 08:24:55 Knust, [name] ([id number]) -------------------------------------------------------------------------------- Vitals Details Patient Name: Pflug, Kailan L. Date of Service: 02/20/2017 8:00 AM Medical Record Number: [id number] Patient Account Number: [id number] Date of Birth/Sex: 23-Apr-1986 (30 y.o. Male) Treating RN: Curtis Sites Primary Care Caylon Saine: PATIENT, NO Other Clinician: Referring Eran Mistry: Phineas Semen Treating Ranata Laughery/Extender: Rudene Re in Treatment: 0 Vital Signs Time Taken: 08:13 Temperature (F): 98.5 Height (in): 72 Pulse (bpm): 59 Source: Measured Respiratory Rate (breaths/min): 18 Weight (lbs): 150 Blood Pressure (mmHg): 124/77 Source: Measured Reference Range: 80 - 120 mg / dl Body Mass Index (BMI):  20.3 Electronic Signature(s) Signed: 02/20/2017 4:55:34 PM By: Curtis Sites Entered ByFrancesco Sor,  Joanna on 02/20/2017 08:14:36

## 2017-02-21 NOTE — Progress Notes (Signed)
[name], [name] ([id number]) Visit Report for 02/20/2017 Chief Complaint Document Details Patient Name: [name], [name] Date of Service: 02/20/2017 8:00 AM Medical Record Number: [id number] Patient Account Number: [id number] Date of Birth/Sex: 07/13/1986 (30 y.o. Male) Treating RN: Curtis Sitesorthy, Joanna Primary Care Provider: PATIENT, NO Other Clinician: Referring Provider: Phineas SemenGoodman, Graydon Treating Provider/Extender: Rudene ReBritto, Dima Ferrufino Weeks in Treatment: 0 Information Obtained from: Patient Chief Complaint Patient presents to the wound care center with open non-healing surgical wound to the left second and third finger which have been there for 6 weeks Electronic Signature(s) Signed: 02/20/2017 9:12:11 AM By: Evlyn KannerBritto, Yaxiel Minnie MD, FACS Entered By: Evlyn KannerBritto, Tasman Zapata on 02/20/2017 09:12:11 [name], [name] ([id number]) -------------------------------------------------------------------------------- Debridement Details Patient Name: Mcnabb, [name] Date of Service: 02/20/2017 8:00 AM Medical Record Number: [id number] Patient Account Number: [id number] Date of Birth/Sex: 06/20/1986 (30 y.o. Male) Treating RN: Curtis Sitesorthy, Joanna Primary Care Provider: PATIENT, NO Other Clinician: Referring Provider: Phineas SemenGoodman, Graydon Treating Provider/Extender: Rudene ReBritto, Legacy Lacivita Weeks in Treatment: 0 Debridement Performed for Wound #1 Left Hand - 3rd Digit Assessment: Performed By: Physician Evlyn KannerBritto, Kieli Golladay, MD Debridement: Open Wound/Selective Debridement Selective Description: Pre-procedure Verification/Time Out Yes - 08:41 Taken: Start Time: 08:41 Pain Control: Lidocaine 4% Topical Solution Level: Non-Viable Tissue Total Area Debrided (L x 0.1 (cm) x 0.1 (cm) = 0.01 (cm) W): Tissue and other Non-Viable, Eschar, Other, Skin material debrided: Instrument: Forceps, Scissors Bleeding: None End Time: 08:44 Procedural Pain: 0 Post Procedural Pain: 0 Response to Treatment: Procedure was tolerated  well Post Debridement Measurements of Total Wound Length: (cm) 0.1 Width: (cm) 0.1 Depth: (cm) 0.1 Volume: (cm) 0.001 Character of Wound/Ulcer Post Improved Debridement: Post Procedure Diagnosis Same as Pre-procedure Notes the patient had sutures placed on the left middle finger which had been there for 6 weeks. There was a lot of superficial debris and necrotic skin which was sharply removed with forceps and scissors. Electronic Signature(s) Signed: 02/20/2017 9:11:51 AM By: Evlyn KannerBritto, Ayyub Krall MD, FACS [name], [name] ([id number]) Signed: 02/20/2017 4:55:34 PM By: Curtis Sitesorthy, Joanna Entered By: Evlyn KannerBritto, Dejour Vos on 02/20/2017 09:11:51 [name], [name] ([id number]) -------------------------------------------------------------------------------- HPI Details Patient Name: [name], [name] Date of Service: 02/20/2017 8:00 AM Medical Record Number: [id number] Patient Account Number: [id number] Date of Birth/Sex: 09/29/1985 (30 y.o. Male) Treating RN: Curtis Sitesorthy, Joanna Primary Care Provider: PATIENT, NO Other Clinician: Referring Provider: Phineas SemenGoodman, Graydon Treating Provider/Extender: Rudene ReBritto, Felesha Moncrieffe Weeks in Treatment: 0 History of Present Illness HPI Description: this 31 year old male had a injury to the left second and third finger on 01/12/2017 and was seen in the ER and treated appropriately. X-ray showed no fracture and sutures were placed and local care was given. The patient was initially referred to orthopedics after discussion with the on-call orthopedic surgeon but later I understand there was no appointment available as there was no bony injury involved. He was also referred to a hand surgeon but the patient did not keep this appointment. The patient has been noncompliant and has not followed up with our wound clinic he had early appointments. He comes in today with sutures intact on his left middle finger and he is unable to flex and extend his fingers appropriately. He has  no other significant medical history. Electronic Signature(s) Signed: 02/20/2017 9:13:32 AM By: Evlyn KannerBritto, Kendy Haston MD, FACS Entered By: Evlyn KannerBritto, Devarius Nelles on 02/20/2017 09:13:31 [name], [name] ([id number]) -------------------------------------------------------------------------------- Physical Exam Details Patient Name: [name], [name] Date of Service: 02/20/2017 8:00 AM Medical Record Number: [id number] Patient Account Number: [id number] Date of Birth/Sex: 08/05/1985 (30 y.o. Male) Treating RN: Curtis Sitesorthy, Joanna  Primary Care Provider: PATIENT, NO Other Clinician: Referring Provider: Phineas Semen Treating Provider/Extender: Rudene Re in Treatment: 0 Constitutional . Pulse regular. Respirations normal and unlabored. Afebrile. . Eyes Nonicteric. Reactive to light. Ears, Nose, Mouth, and Throat Lips, teeth, and gums WNL.Marland Kitchen Moist mucosa without lesions. Neck supple and nontender. No palpable supraclavicular or cervical adenopathy. Normal sized without goiter. Respiratory WNL. No retractions.. Cardiovascular Pedal Pulses WNL. No clubbing, cyanosis or edema. Gastrointestinal (GI) Abdomen without masses or tenderness.. No liver or spleen enlargement or tenderness.. Lymphatic No adneopathy. No adenopathy. No adenopathy. Musculoskeletal Adexa without tenderness or enlargement.. Digits and nails w/o clubbing, cyanosis, infection, petechiae, ischemia, or inflammatory conditions.. Integumentary (Hair, Skin) No suspicious lesions. No crepitus or fluctuance. No peri-wound warmth or erythema. No masses.Marland Kitchen Psychiatric Judgement and insight Intact.. No evidence of depression, anxiety, or agitation.. Notes the tips of the left index and middle finger were involved with trauma about 6 weeks ago. Debris was removed sharply and there are no open wounds. 2-0 nylon sutures were incidental and these have been removed today. After careful observation and examination there are no open  wounds. Electronic Signature(s) Signed: 02/20/2017 9:14:20 AM By: Evlyn Kanner MD, FACS Entered By: Evlyn Kanner on 02/20/2017 09:14:18 Drewes, [name] ([id number]) -------------------------------------------------------------------------------- Physician Orders Details Patient Name: Daubenspeck, [name] Date of Service: 02/20/2017 8:00 AM Medical Record Number: [id number] Patient Account Number: [id number] Date of Birth/Sex: 14-Nov-1985 (30 y.o. Male) Treating RN: Curtis Sites Primary Care Provider: PATIENT, NO Other Clinician: Referring Provider: Phineas Semen Treating Provider/Extender: Rudene Re in Treatment: 0 Verbal / Phone Orders: No Diagnosis Coding Discharge From [facility] o Discharge from Wound Care Center Electronic Signature(s) Signed: 02/20/2017 4:12:07 PM By: Evlyn Kanner MD, FACS Signed: 02/20/2017 4:55:34 PM By: Curtis Sites Entered By: Curtis Sites on 02/20/2017 08:46:12 Barrientes, [name] ([id number]) -------------------------------------------------------------------------------- Problem List Details Patient Name: Pe, [name] Date of Service: 02/20/2017 8:00 AM Medical Record Number: [id number] Patient Account Number: [id number] Date of Birth/Sex: 01/28/1986 (30 y.o. Male) Treating RN: Curtis Sites Primary Care Provider: PATIENT, NO Other Clinician: Referring Provider: Phineas Semen Treating Provider/Extender: Rudene Re in Treatment: 0 Active Problems ICD-10 Encounter Code Description Active Date Diagnosis S61.211A Laceration without foreign body of left index finger without 02/20/2017 Yes damage to nail, initial encounter S61.213A Laceration without foreign body of left middle finger 02/20/2017 Yes without damage to nail, initial encounter Inactive Problems Resolved Problems Electronic Signature(s) Signed: 02/20/2017 9:10:49 AM By: Evlyn Kanner MD, FACS Entered By: Evlyn Kanner on 02/20/2017  09:10:49 Klingensmith, [name] ([id number]) -------------------------------------------------------------------------------- Progress Note Details Patient Name: Christopoulos, [name] Date of Service: 02/20/2017 8:00 AM Medical Record Number: [id number] Patient Account Number: [id number] Date of Birth/Sex: [birth year] (30 y.o. Male) Treating RN: Curtis Sites Primary Care Provider: PATIENT, NO Other Clinician: Referring Provider: Phineas Semen Treating Provider/Extender: Rudene Re in Treatment: 0 Subjective Chief Complaint Information obtained from Patient Patient presents to the wound care center with open non-healing surgical wound to the left second and third finger which have been there for 6 weeks History of Present Illness (HPI) this 31 year old male had a injury to the left second and third finger on 01/12/2017 and was seen in the ER and treated appropriately. X-ray showed no fracture and sutures were placed and local care was given. The patient was initially referred to orthopedics after discussion with the on-call orthopedic surgeon but later I understand there was no appointment available as there was no bony injury involved. He was also referred to  a hand surgeon but the patient did not keep this appointment. The patient has been noncompliant and has not followed up with our wound clinic he had early appointments. He comes in today with sutures intact on his left middle finger and he is unable to flex and extend his fingers appropriately. He has no other significant medical history. Wound History Patient presents with 2 open wounds that have been present for approximately June 26. Patient has been treating wounds in the following manner: neosporin and bandage. Laboratory tests have not been performed in the last month. Patient reportedly has not tested positive for an antibiotic resistant organism. Patient reportedly has not tested positive for osteomyelitis.  Patient reportedly has not had testing performed to evaluate circulation in the legs. Patient History Information obtained from Patient. Allergies No Known Drug Allergies Social History Never smoker, Marital Status - Single, Alcohol Use - Rarely, Drug Use - No History, Caffeine Use - Moderate. Medical History Respiratory Patient has history of Asthma Mccleave, [name] ([id number]) Review of Systems (ROS) Constitutional Symptoms (General Health) The patient has no complaints or symptoms. Eyes The patient has no complaints or symptoms. Ear/Nose/Mouth/Throat The patient has no complaints or symptoms. Hematologic/Lymphatic The patient has no complaints or symptoms. Respiratory The patient has no complaints or symptoms. Cardiovascular The patient has no complaints or symptoms. Gastrointestinal The patient has no complaints or symptoms. Endocrine The patient has no complaints or symptoms. Genitourinary The patient has no complaints or symptoms. Immunological The patient has no complaints or symptoms. Integumentary (Skin) The patient has no complaints or symptoms. Musculoskeletal The patient has no complaints or symptoms. Neurologic The patient has no complaints or symptoms. Oncologic The patient has no complaints or symptoms. Psychiatric The patient has no complaints or symptoms. Medications: Nil Objective Constitutional Pulse regular. Respirations normal and unlabored. Afebrile. Vitals Time Taken: 8:13 AM, Height: 72 in, Source: Measured, Weight: 150 lbs, Source: Measured, BMI: 20.3, Temperature: 98.5 F, Pulse: 59 bpm, Respiratory Rate: 18 breaths/min, Blood Pressure: 124/77 Kuk, [name] ([id number]) mmHg. Eyes Nonicteric. Reactive to light. Ears, Nose, Mouth, and Throat Lips, teeth, and gums WNL.Marland Kitchen Moist mucosa without lesions. Neck supple and nontender. No palpable supraclavicular or cervical adenopathy. Normal sized without goiter. Respiratory WNL.  No retractions.. Cardiovascular Pedal Pulses WNL. No clubbing, cyanosis or edema. Gastrointestinal (GI) Abdomen without masses or tenderness.. No liver or spleen enlargement or tenderness.. Lymphatic No adneopathy. No adenopathy. No adenopathy. Musculoskeletal Adexa without tenderness or enlargement.. Digits and nails w/o clubbing, cyanosis, infection, petechiae, ischemia, or inflammatory conditions.Marland Kitchen Psychiatric Judgement and insight Intact.. No evidence of depression, anxiety, or agitation.. General Notes: the tips of the left index and middle finger were involved with trauma about 6 weeks ago. Debris was removed sharply and there are no open wounds. 2-0 nylon sutures were incidental and these have been removed today. After careful observation and examination there are no open wounds. Integumentary (Hair, Skin) No suspicious lesions. No crepitus or fluctuance. No peri-wound warmth or erythema. No masses.. Wound #1 status is Open. Original cause of wound was Trauma. The wound is located on the Left Hand - 3rd Digit. The wound measures 0.1cm length x 0.1cm width x 0.1cm depth; 0.008cm^2 area and 0.001cm^3 volume. There is no tunneling or undermining noted. There is a large amount of serous drainage noted. The wound margin is flat and intact. There is no granulation within the wound bed. There is a large (67-100%) amount of necrotic tissue within the wound bed including Eschar. The periwound skin  appearance did not exhibit: Callus, Crepitus, Excoriation, Induration, Rash, Scarring, Dry/Scaly, Maceration, Atrophie Blanche, Cyanosis, Ecchymosis, Hemosiderin Staining, Mottled, Pallor, Rubor, Erythema. The periwound has tenderness on palpation. RYLEND, PIETRZAK ([id number]) Assessment Active Problems ICD-10 [id number] - Laceration without foreign body of left index finger without damage to nail, initial encounter J47.829F - Laceration without foreign body of left middle finger without  damage to nail, initial encounter this 31 year old patient who had a lacerated wound to the tip of his left hand was treated 6 weeks ago in the ER. He has failed to follow-up appropriately for suture removal and further care and has arrived today with intact sutures and a lot of debris on his wounds. After removing this carefully and removing his sutures there are no open wounds. I have recommended he follow-up with his PCP and possibly get an appointment to see a hand surgeon in case he needs rehabilitation. He understands that there are no open wounds and will not need to follow-up with the wound clinic here. Procedures Wound #1 Pre-procedure diagnosis of Wound #1 is a Trauma, Other located on the Left Hand - 3rd Digit . There was a Non-Viable Tissue Open Wound/Selective [phone]) debridement with total area of 0.01 sq cm performed by Evlyn Kanner, MD. with the following instrument(s): Forceps and Scissors to remove Non-Viable tissue/material including Eschar, Other, and Skin after achieving pain control using Lidocaine 4% Topical Solution. A time out was conducted at 08:41, prior to the start of the procedure. There was no bleeding. The procedure was tolerated well with a pain level of 0 throughout and a pain level of 0 following the procedure. Post Debridement Measurements: 0.1cm length x 0.1cm width x 0.1cm depth; 0.001cm^3 volume. Character of Wound/Ulcer Post Debridement is improved. Post procedure Diagnosis Wound #1: Same as Pre-Procedure General Notes: the patient had sutures placed on the left middle finger which had been there for 6 weeks. There was a lot of superficial debris and necrotic skin which was sharply removed with forceps and scissors.. Plan Discharge From [facility]: Discharge from [facility] Safranek, [name] ([id number]) this 31 year old patient who had a lacerated wound to the tip of his left hand was treated 6 weeks ago in the ER. He has failed  to follow-up appropriately for suture removal and further care and has arrived today with intact sutures and a lot of debris on his wounds. After removing this carefully and removing his sutures there are no open wounds. I have recommended he follow-up with his PCP and possibly get an appointment to see a hand surgeon in case he needs rehabilitation. He understands that there are no open wounds and will not need to follow-up with the wound clinic here. Electronic Signature(s) Signed: 02/20/2017 9:16:48 AM By: Evlyn Kanner MD, FACS Entered By: Evlyn Kanner on 02/20/2017 09:16:48 Housley, [name] ([id number]) -------------------------------------------------------------------------------- ROS/PFSH Details Patient Name: Beezley, Arieh L. Date of Service: 02/20/2017 8:00 AM Medical Record Number: [id number] Patient Account Number: [id number] Date of Birth/Sex: [birth year] (30 y.o. Male) Treating RN: Curtis Sites Primary Care Provider: PATIENT, NO Other Clinician: Referring Provider: Phineas Semen Treating Provider/Extender: Rudene Re in Treatment: 0 Information Obtained From Patient Wound History Do you currently have one or more open woundso Yes How many open wounds do you currently haveo 2 Approximately how long have you had your woundso June 26 How have you been treating your wound(s) until nowo neosporin and bandage Has your wound(s) ever healed and then re-openedo No Have you had any lab  work done in the past montho No Have you tested positive for an antibiotic resistant organism (MRSA, VRE)o No Have you tested positive for osteomyelitis (bone infection)o No Have you had any tests for circulation on your legso No Constitutional Symptoms (General Health) Complaints and Symptoms: No Complaints or Symptoms Eyes Complaints and Symptoms: No Complaints or Symptoms Ear/Nose/Mouth/Throat Complaints and Symptoms: No Complaints or  Symptoms Hematologic/Lymphatic Complaints and Symptoms: No Complaints or Symptoms Respiratory Complaints and Symptoms: No Complaints or Symptoms Medical History: Positive for: Asthma Cardiovascular Witherell, Kevon L. ([id number]) Complaints and Symptoms: No Complaints or Symptoms Gastrointestinal Complaints and Symptoms: No Complaints or Symptoms Endocrine Complaints and Symptoms: No Complaints or Symptoms Genitourinary Complaints and Symptoms: No Complaints or Symptoms Immunological Complaints and Symptoms: No Complaints or Symptoms Integumentary (Skin) Complaints and Symptoms: No Complaints or Symptoms Musculoskeletal Complaints and Symptoms: No Complaints or Symptoms Neurologic Complaints and Symptoms: No Complaints or Symptoms Oncologic Complaints and Symptoms: No Complaints or Symptoms Psychiatric Complaints and Symptoms: No Complaints or Symptoms Immunizations Pneumococcal Vaccine: Received Pneumococcal Vaccination: No Pizano, Gary Elbert EwingsL. ([id number]) Family and Social History Never smoker; Marital Status - Single; Alcohol Use: Rarely; Drug Use: No History; Caffeine Use: Moderate; Financial Concerns: No; Food, Clothing or Shelter Needs: No; Support System Lacking: No; Transportation Concerns: No; Advanced Directives: No; Patient does not want information on Advanced Directives Physician Affirmation I have reviewed and agree with the above information. Electronic Signature(s) Signed: 02/20/2017 4:12:07 PM By: Evlyn KannerBritto, Alexus Michael MD, FACS Signed: 02/20/2017 4:55:34 PM By: Curtis Sitesorthy, Joanna Entered By: Evlyn KannerBritto, Ardenia Stiner on 02/20/2017 09:09:27 Riebel, [name] ([id number]) -------------------------------------------------------------------------------- SuperBill Details Patient Name: Pralle, [name] Date of Service: 02/20/2017 Medical Record Number: [id number] Patient Account Number: [id number] Date of Birth/Sex: 07/08/1986 (30 y.o. Male) Treating RN: Curtis Sitesorthy,  Joanna Primary Care Provider: PATIENT, NO Other Clinician: Referring Provider: Phineas SemenGoodman, Graydon Treating Provider/Extender: Rudene ReBritto, Maceo Hernan Weeks in Treatment: 0 Diagnosis Coding ICD-10 Codes Code Description Laceration without foreign body of left index finger without damage to nail, initial S61.211A encounter Laceration without foreign body of left middle finger without damage to nail, initial S61.213A encounter Facility Procedures CPT4: Description Modifier Quantity Code [id number] 99213 - WOUND CARE VISIT-LEV 3 EST PT 1 CPT4: [id number] 97597 - DEBRIDE WOUND 1ST 20 SQ CM OR < 1 ICD-10 Description Diagnosis S61.211A Laceration without foreign body of left index finger without damage to nail, initial encounter S61.213A Laceration without foreign body of left middle finger  without damage to nail, initial encounter Physician Procedures CPT4: Description Modifier Quantity Code [id number] 99204 - WC PHYS LEVEL 4 - NEW PT 25 1 ICD-10 Description Diagnosis S61.211A Laceration without foreign body of left index finger without damage to nail, initial encounter S61.213A Laceration without  foreign body of left middle finger without damage to nail, initial encounter CPT4: [id number] 97597 - WC PHYS DEBR WO ANESTH 20 SQ CM 1 ICD-10 Description Diagnosis S61.211A Laceration without foreign body of left index finger without damage to nail, initial encounter Kemple, [name] ([id number]) Electronic Signature(s) Signed: 02/20/2017 9:54:38 AM By: Curtis Sitesorthy, Joanna Signed: 02/20/2017 4:12:07 PM By: Evlyn KannerBritto, Shaquna Geigle MD, FACS Previous Signature: 02/20/2017 9:17:20 AM Version By: Evlyn KannerBritto, Annalei Friesz MD, FACS Previous Signature: 02/20/2017 9:17:02 AM Version By: Evlyn KannerBritto, Nazeer Romney MD, FACS Entered By: Curtis Sitesorthy, Joanna on 02/20/2017 09:54:37

## 2018-02-01 ENCOUNTER — Encounter: Payer: Self-pay | Admitting: Emergency Medicine

## 2018-02-01 ENCOUNTER — Emergency Department
Admission: EM | Admit: 2018-02-01 | Discharge: 2018-02-01 | Disposition: A | Discharge status: Home / Self Care | Payer: Self-pay | Attending: Emergency Medicine | Admitting: Emergency Medicine

## 2018-02-01 ENCOUNTER — Other Ambulatory Visit: Payer: Self-pay

## 2018-02-01 DIAGNOSIS — F1010 Alcohol abuse, uncomplicated: Secondary | ICD-10-CM

## 2018-02-01 DIAGNOSIS — F10920 Alcohol use, unspecified with intoxication, uncomplicated: Secondary | ICD-10-CM | POA: Insufficient documentation

## 2018-02-01 DIAGNOSIS — F321 Major depressive disorder, single episode, moderate: Secondary | ICD-10-CM | POA: Insufficient documentation

## 2018-02-01 DIAGNOSIS — J45909 Unspecified asthma, uncomplicated: Secondary | ICD-10-CM | POA: Insufficient documentation

## 2018-02-01 DIAGNOSIS — F1994 Other psychoactive substance use, unspecified with psychoactive substance-induced mood disorder: Secondary | ICD-10-CM

## 2018-02-01 DIAGNOSIS — R45851 Suicidal ideations: Secondary | ICD-10-CM | POA: Insufficient documentation

## 2018-02-01 DIAGNOSIS — F4325 Adjustment disorder with mixed disturbance of emotions and conduct: Secondary | ICD-10-CM

## 2018-02-01 DIAGNOSIS — Y906 Blood alcohol level of 120-199 mg/100 ml: Secondary | ICD-10-CM | POA: Insufficient documentation

## 2018-02-01 LAB — CBC WITH DIFFERENTIAL/PLATELET
BASOS PCT: 0 %
Basophils Absolute: 0 10*3/uL (ref 0–0.1)
Eosinophils Absolute: 0.1 10*3/uL (ref 0–0.7)
Eosinophils Relative: 2 %
HEMATOCRIT: 45.2 % (ref 40.0–52.0)
HEMOGLOBIN: 15.3 g/dL (ref 13.0–18.0)
LYMPHS ABS: 1.4 10*3/uL (ref 1.0–3.6)
Lymphocytes Relative: 19 %
MCH: 31.9 pg (ref 26.0–34.0)
MCHC: 33.9 g/dL (ref 32.0–36.0)
MCV: 93.9 fL (ref 80.0–100.0)
MONOS PCT: 4 %
Monocytes Absolute: 0.3 10*3/uL (ref 0.2–1.0)
NEUTROS PCT: 75 %
Neutro Abs: 5.5 10*3/uL (ref 1.4–6.5)
Platelets: 253 10*3/uL (ref 150–440)
RBC: 4.81 MIL/uL (ref 4.40–5.90)
RDW: 13.7 % (ref 11.5–14.5)
WBC: 7.3 10*3/uL (ref 3.8–10.6)

## 2018-02-01 LAB — COMPREHENSIVE METABOLIC PANEL
ALBUMIN: 4.6 g/dL (ref 3.5–5.0)
ALT: 15 U/L (ref 0–44)
ANION GAP: 8 (ref 5–15)
AST: 18 U/L (ref 15–41)
Alkaline Phosphatase: 53 U/L (ref 38–126)
BUN: 13 mg/dL (ref 6–20)
CHLORIDE: 107 mmol/L (ref 98–111)
CO2: 28 mmol/L (ref 22–32)
Calcium: 9 mg/dL (ref 8.9–10.3)
Creatinine, Ser: 1.07 mg/dL (ref 0.61–1.24)
GFR calc Af Amer: >60 mL/min (ref >60)
GFR calc non Af Amer: >60 mL/min (ref >60)
GLUCOSE: 98 mg/dL (ref 70–99)
Potassium: 3.8 mmol/L (ref 3.5–5.1)
SODIUM: 143 mmol/L (ref 135–145)
TOTAL PROTEIN: 7.9 g/dL (ref 6.5–8.1)
Total Bilirubin: 0.6 mg/dL (ref 0.3–1.2)

## 2018-02-01 LAB — SALICYLATE LEVEL: Salicylate Lvl: <7 mg/dL (ref 2.8–30.0)

## 2018-02-01 LAB — ETHANOL: Alcohol, Ethyl (B): 152 mg/dL — ABNORMAL HIGH (ref <10)

## 2018-02-01 LAB — ACETAMINOPHEN LEVEL

## 2018-02-01 MED ORDER — LORAZEPAM 1 MG PO TABS
1.0000 mg | ORAL_TABLET | Freq: Once | ORAL | Status: AC
  Administered 2018-02-01: 1 mg via ORAL
  Filled 2018-02-01: qty 1

## 2018-02-01 NOTE — ED Notes (Signed)
Pt anxious and states he is ready to be discharged so he can go home to his children. RN explained he needed to speak with psychiatrist. Pt understanding. Maintained on 15 minute checks and observation by security camera for safety.

## 2018-02-01 NOTE — Discharge Instructions (Signed)
It was a pleasure to take care of you today, and thank you for coming to our emergency department.  If you have any questions or concerns before leaving please ask the nurse to grab me and I'm more than happy to go through your aftercare instructions again.  If you were prescribed any opioid pain medication today such as Norco, Vicodin, Percocet, morphine, hydrocodone, or oxycodone please make sure you do not drive when you are taking this medication as it can alter your ability to drive safely.  If you have any concerns once you are home that you are not improving or are in fact getting worse before you can make it to your follow-up appointment, please do not hesitate to call 911 and come back for further evaluation.  Merrily BrittleNeil Noach Calvillo, MD  Results for orders placed or performed during the hospital encounter of 02/01/18  Comprehensive metabolic panel  Result Value Ref Range   Sodium 143 135 - 145 mmol/L   Potassium 3.8 3.5 - 5.1 mmol/L   Chloride 107 98 - 111 mmol/L   CO2 28 22 - 32 mmol/L   Glucose, Bld 98 70 - 99 mg/dL   BUN 13 6 - 20 mg/dL   Creatinine, Ser 1.611.07 0.61 - 1.24 mg/dL   Calcium 9.0 8.9 - 09.610.3 mg/dL   Total Protein 7.9 6.5 - 8.1 g/dL   Albumin 4.6 3.5 - 5.0 g/dL   AST 18 15 - 41 U/L   ALT 15 0 - 44 U/L   Alkaline Phosphatase 53 38 - 126 U/L   Total Bilirubin 0.6 0.3 - 1.2 mg/dL   GFR calc non Af Amer >60 >60 mL/min   GFR calc Af Amer >60 >60 mL/min   Anion gap 8 5 - 15  Ethanol  Result Value Ref Range   Alcohol, Ethyl (B) 152 (H) <10 mg/dL  CBC with Diff  Result Value Ref Range   WBC 7.3 3.8 - 10.6 K/uL   RBC 4.81 4.40 - 5.90 MIL/uL   Hemoglobin 15.3 13.0 - 18.0 g/dL   HCT 04.545.2 40.940.0 - 81.152.0 %   MCV 93.9 80.0 - 100.0 fL   MCH 31.9 26.0 - 34.0 pg   MCHC 33.9 32.0 - 36.0 g/dL   RDW 91.413.7 78.211.5 - 95.614.5 %   Platelets 253 150 - 440 K/uL   Neutrophils Relative % 75 %   Neutro Abs 5.5 1.4 - 6.5 K/uL   Lymphocytes Relative 19 %   Lymphs Abs 1.4 1.0 - 3.6 K/uL   Monocytes  Relative 4 %   Monocytes Absolute 0.3 0.2 - 1.0 K/uL   Eosinophils Relative 2 %   Eosinophils Absolute 0.1 0 - 0.7 K/uL   Basophils Relative 0 %   Basophils Absolute 0.0 0 - 0.1 K/uL  Salicylate level  Result Value Ref Range   Salicylate Lvl <7.0 2.8 - 30.0 mg/dL  Acetaminophen level  Result Value Ref Range   Acetaminophen (Tylenol), Serum <10 (L) 10 - 30 ug/mL

## 2018-02-01 NOTE — ED Notes (Signed)
Pt to the er for suicidal thoughts. Pt states that his girlfriend left home and he spent the day looking for her. Pt denies any fights at home. Reports father is stopping chemo treatment for lung cancer and his girlfriend is on drugs.

## 2018-02-01 NOTE — ED Notes (Signed)
Pt reports to this Rn that he has to be in court in the morning. Advised we can let him use the phone in the morning to call the courthouse.

## 2018-02-01 NOTE — ED Provider Notes (Signed)
[facility] Emergency Department Provider Note   ____________________________________________   First MD Initiated Contact with Patient 02/01/18 [phone]     (approximate)  I have reviewed the triage vital signs and the nursing notes.   HISTORY  Chief Complaint Psychiatric Evaluation    HPI [name] is a 32 y.o. male brought to the ED by police under IVC for depression with suicidal ideation.  Reportedly patient's girlfriend recently left him and he found out that his dad has stopped cancer treatments.  Having to take care of his 2 young children, who are currently with his mother.  Patient was found walking [place] the railroad tracks and endorses suicidal thoughts.  Denies HI/AH/VH.  No prior psychiatric history.  Voices no medical complaints.   Past Medical History:  Diagnosis Date  . Asthma     There are no active problems to display for this patient.   Past Surgical History:  Procedure Laterality Date  . TONSILLECTOMY      Prior to Admission medications   Medication Sig Start Date End Date Taking? Authorizing Provider  Buprenorphine HCl-Naloxone HCl (SUBOXONE) 8-2 MG FILM Place 1 Film under the tongue as needed.    [provider]    Allergies Patient has no known allergies.  History reviewed. No pertinent family history.  Social History Social History   Tobacco Use  . Smoking status: Never Smoker  . Smokeless tobacco: Never Used  Substance Use Topics  . Alcohol use: Yes    Alcohol/week: 7.8 oz    Types: 13 Cans of beer per week  . Drug use: Yes    Types: Cocaine    Review of Systems  Constitutional: No fever/chills Eyes: No visual changes. ENT: No sore throat. Cardiovascular: Denies chest pain. Respiratory: Denies shortness of breath. Gastrointestinal: No abdominal pain.  No nausea, no vomiting.  No diarrhea.  No constipation. Genitourinary: Negative for dysuria. Musculoskeletal: Negative for back  pain. Skin: Negative for rash. Neurological: Negative for headaches, focal weakness or numbness. Psychiatric:Positive for depression with SI.  ____________________________________________   PHYSICAL EXAM:  VITAL SIGNS: ED Triage Vitals  Enc Vitals Group     BP 02/01/18 0400 (!) 135/99     Pulse Rate 02/01/18 0400 64     Resp 02/01/18 0400 20     Temp 02/01/18 0400 98.4 F (36.9 C)     Temp src --      SpO2 02/01/18 0400 100 %     Weight 02/01/18 0401 150 lb (68 kg)     Height 02/01/18 0401 6' (1.829 m)     Head Circumference --      Peak Flow --      Pain Score 02/01/18 0401 0     Pain Loc --      Pain Edu? --      Excl. in GC? --     Constitutional: Alert and oriented. Well appearing and in mild acute distress.  Tearful. Eyes: Conjunctivae are normal. PERRL. EOMI. Head: Atraumatic. Nose: No congestion/rhinnorhea. Mouth/Throat: Mucous membranes are moist.  Oropharynx non-erythematous. Neck: No stridor.   Cardiovascular: Normal rate, regular rhythm. Grossly normal heart sounds.  Good peripheral circulation. Respiratory: Normal respiratory effort.  No retractions. Lungs CTAB. Gastrointestinal: Soft and nontender. No distention. No abdominal bruits. No CVA tenderness. Musculoskeletal: No lower extremity tenderness nor edema.  No joint effusions. Neurologic:  Normal speech and language. No gross focal neurologic deficits are appreciated. No gait instability. Skin:  Skin is warm, dry  and intact. No rash noted. Psychiatric: Mood and affect are tearful, flat. Speech and behavior are normal.  ____________________________________________   LABS (all labs ordered are listed, but only abnormal results are displayed)  Labs Reviewed  ETHANOL - Abnormal; Notable for the following components:      Result Value   Alcohol, Ethyl (B) 152 (*)    All other components within normal limits  ACETAMINOPHEN LEVEL - Abnormal; Notable for the following components:   Acetaminophen  (Tylenol), Serum <10 (*)    All other components within normal limits  COMPREHENSIVE METABOLIC PANEL  CBC WITH DIFFERENTIAL/PLATELET  SALICYLATE LEVEL  URINE DRUG SCREEN, QUALITATIVE ([facility] ONLY)   ____________________________________________  EKG  None ____________________________________________  RADIOLOGY  ED MD interpretation: None  Official radiology report(s): No results found.  ____________________________________________   PROCEDURES  Procedure(s) performed: None  Procedures  Critical Care performed: No  ____________________________________________   INITIAL IMPRESSION / ASSESSMENT AND PLAN / ED COURSE  As part of my medical decision making, I reviewed the following data within the electronic MEDICAL RECORD NUMBER Nursing notes reviewed and incorporated, Labs reviewed, Old chart reviewed, A consult was requested and obtained from this/these consultant(s) Psychiatry and Notes from prior ED visits   32 year old male who presents under IVC for depression with suicidal ideation.  Laboratory results notable for elevated EtOH.  Ativan given for anxiolysis.  Will maintain patient's IVC pending psychiatric evaluation.  Patient is medically cleared for psychiatric disposition.      ____________________________________________   FINAL CLINICAL IMPRESSION(S) / ED DIAGNOSES  Final diagnoses:  Current moderate episode of major depressive disorder without prior episode (HCC)  Alcoholic intoxication without complication [facility])     ED Discharge Orders    None       Note:  This document was prepared using Dragon voice recognition software and may include unintentional dictation errors.    Irean Hong, MD 02/01/18 [phone]

## 2018-02-01 NOTE — Consult Note (Addendum)
[facility] Psychiatry Consult   Reason for Consult: Consult for 32 year old man brought to the hospital by law enforcement after being found drunk walking [place] the [place] tracks Referring Physician: Rifenbark Patient Identification: [name] MRN:  [id number] Principal Diagnosis: Adjustment disorder with mixed disturbance of emotions and conduct Diagnosis:   Patient Active Problem List   Diagnosis Date Noted  . Adjustment disorder with mixed disturbance of emotions and conduct [F43.25] 02/01/2018  . Alcohol abuse [F10.10] 02/01/2018  . Substance induced mood disorder [facility]) [F19.94] 02/01/2018    Total Time spent with patient: 1 hour  Subjective:   [name] is a 32 y.o. male patient admitted with "I have had a lot of stress".  HPI: Patient interviewed chart reviewed.  32 year old man brought to the emergency room last night after police found him wandering along the railroad tracks intoxicated.  Patient made comments to the police about having suicidal thoughts about standing in front of a train.  Patient was intoxicated when he got to the emergency room and has been intermittently agitated today about being here at all but was calm and appropriate talking with me.  He tells me that yesterday morning his long-term girlfriend, with whom he has 3 young children, left the home and seems to have left a message that she is leaving for good.  This was sort of the last straw for our patient who already is under a lot of stress because his father is dying of cancer.  Patient says his sleep is chronically poor.  Appetite not so great.  Denies having had suicidal thoughts in the past.  Says that yesterday even after having suicidal thoughts he watched trains past by and did not jump in front of them.  Today has no suicidal thought at all.  Denies psychotic symptoms.  Admits that he occasionally uses cocaine but says he was not using any yesterday.  Not receiving any mental  health treatment.  Social history: Patient is together with a long-term girlfriend with whom he has 3 children.  He believes that she left him yesterday permanently although it sounds like his evidence is a little sketchy.  He works doing long hours of heavy menial labor.  Medical history: Mild asthma that does not bother him at all as an adult.  Substance abuse history: Long-standing issues with alcohol abuse.  Drinks every day.  Says that most drinks is only about the equivalent of 1 or 2 beers but occasionally it will become a problem.  No history of DTs or seizures.  Does admit that he also uses cocaine at times  Past Psychiatric History: No history of mental health treatment at all no hospitalization no medication no meetings with mental health providers  Risk to Self: Suicidal Ideation: No Suicidal Intent: No Is patient at risk for suicide?: No Suicidal Plan?: No Access to Means: No What has been your use of drugs/alcohol within the last 12 months?: ETOH  How many times?: 0 Other Self Harm Risks: Ongoing SA use Triggers for Past Attempts: Other (Comment)(None reported ) Intentional Self Injurious Behavior: None Risk to Others: Homicidal Ideation: No Thoughts of Harm to Others: No Current Homicidal Intent: No Current Homicidal Plan: No Access to Homicidal Means: No Identified Victim: None reported History of harm to others?: No Assessment of Violence: None Noted Violent Behavior Description: None reported Does patient have access to weapons?: No Criminal Charges Pending?: Yes Describe Pending Criminal Charges: expired registration tag(speeding, reckless driving to endanger, ) Does  patient have a court date: Yes Court Date: 02/01/18(02/15/2018, 03/01/2018) Prior Inpatient Therapy: Prior Inpatient Therapy: No Prior Outpatient Therapy: Prior Outpatient Therapy: No Does patient have an ACCT team?: No Does patient have Intensive In-House Services?  : No Does patient have [facility]  services? : No Does patient have P4CC services?: No  Past Medical History:  Past Medical History:  Diagnosis Date  . Asthma     Past Surgical History:  Procedure Laterality Date  . TONSILLECTOMY     Family History: History reviewed. No pertinent family history. Family Psychiatric  History: Does not know of any Social History:  Social History   Substance and Sexual Activity  Alcohol Use Yes  . Alcohol/week: 7.8 oz  . Types: 13 Cans of beer per week     Social History   Substance and Sexual Activity  Drug Use Yes  . Types: Cocaine    Social History   Socioeconomic History  . Marital status: Single    Spouse name: Not on file  . Number of children: Not on file  . Years of education: Not on file  . Highest education level: Not on file  Occupational History  . Not on file  Social Needs  . Financial resource strain: Not on file  . Food insecurity:    Worry: Not on file    Inability: Not on file  . Transportation needs:    Medical: Not on file    Non-medical: Not on file  Tobacco Use  . Smoking status: Never Smoker  . Smokeless tobacco: Never Used  Substance and Sexual Activity  . Alcohol use: Yes    Alcohol/week: 7.8 oz    Types: 13 Cans of beer per week  . Drug use: Yes    Types: Cocaine  . Sexual activity: Not on file  Lifestyle  . Physical activity:    Days per week: Not on file    Minutes per session: Not on file  . Stress: Not on file  Relationships  . Social connections:    Talks on phone: Not on file    Gets together: Not on file    Attends religious service: Not on file    Active member of club or organization: Not on file    Attends meetings of clubs or organizations: Not on file    Relationship status: Not on file  Other Topics Concern  . Not on file  Social History Narrative  . Not on file   Additional Social History:    Allergies:  No Known Allergies  Labs:  Results for orders placed or performed during the hospital encounter of  02/01/18 (from the past 48 hour(s))  Comprehensive metabolic panel     Status: None   Collection Time: 02/01/18  4:08 AM  Result Value Ref Range   Sodium 143 135 - 145 mmol/L   Potassium 3.8 3.5 - 5.1 mmol/L   Chloride 107 98 - 111 mmol/L    Comment: Please note change in reference range.   CO2 28 22 - 32 mmol/L   Glucose, Bld 98 70 - 99 mg/dL    Comment: Please note change in reference range.   BUN 13 6 - 20 mg/dL    Comment: Please note change in reference range.   Creatinine, Ser 1.07 0.61 - 1.24 mg/dL   Calcium 9.0 8.9 - 10.3 mg/dL   Total Protein 7.9 6.5 - 8.1 g/dL   Albumin 4.6 3.5 - 5.0 g/dL   AST 18 15 - 41  U/L   ALT 15 0 - 44 U/L    Comment: Please note change in reference range.   Alkaline Phosphatase 53 38 - 126 U/L   Total Bilirubin 0.6 0.3 - 1.2 mg/dL   GFR calc non Af Amer >60 >60 mL/min   GFR calc Af Amer >60 >60 mL/min    Comment: (NOTE) The eGFR has been calculated using the CKD EPI equation. This calculation has not been validated in all clinical situations. eGFR's persistently <60 mL/min signify possible Chronic Kidney Disease.    Anion gap 8 5 - 15    Comment: Performed at [facility], [address] [place], [place] [address]  Ethanol     Status: Abnormal   Collection Time: 02/01/18  4:08 AM  Result Value Ref Range   Alcohol, Ethyl (B) 152 (H) <10 mg/dL    Comment: (NOTE) Lowest detectable limit for serum alcohol is 10 mg/dL. For medical purposes only. Performed at [facility], [address] [place], [place] [address]   CBC with Diff     Status: None   Collection Time: 02/01/18  4:08 AM  Result Value Ref Range   WBC 7.3 3.8 - 10.6 K/uL   RBC 4.81 4.40 - 5.90 MIL/uL   Hemoglobin 15.3 13.0 - 18.0 g/dL   HCT 45.2 40.0 - 52.0 %   MCV 93.9 80.0 - 100.0 fL   MCH 31.9 26.0 - 34.0 pg   MCHC 33.9 32.0 - 36.0 g/dL   RDW 13.7 11.5 - 14.5 %   Platelets 253 150 - 440 K/uL   Neutrophils Relative % 75 %   Neutro Abs 5.5 1.4 - 6.5  K/uL   Lymphocytes Relative 19 %   Lymphs Abs 1.4 1.0 - 3.6 K/uL   Monocytes Relative 4 %   Monocytes Absolute 0.3 0.2 - 1.0 K/uL   Eosinophils Relative 2 %   Eosinophils Absolute 0.1 0 - 0.7 K/uL   Basophils Relative 0 %   Basophils Absolute 0.0 0 - 0.1 K/uL    Comment: Performed at [facility], [address] [place], [place] [address]  Salicylate level     Status: None   Collection Time: 02/01/18  4:08 AM  Result Value Ref Range   Salicylate Lvl <9.6 2.8 - 30.0 mg/dL    Comment: Performed at [facility], [address], [place], [place] [address]  Acetaminophen level     Status: Abnormal   Collection Time: 02/01/18  4:08 AM  Result Value Ref Range   Acetaminophen (Tylenol), Serum <10 (L) 10 - 30 ug/mL    Comment: (NOTE) Therapeutic concentrations vary significantly. A range of 10-30 ug/mL  may be an effective concentration for many patients. However, some  are best treated at concentrations outside of this range. Acetaminophen concentrations >150 ug/mL at 4 hours after ingestion  and >50 ug/mL at 12 hours after ingestion are often associated with  toxic reactions. Performed at [facility], [address] [place], [place] [address]     No current facility-administered medications for this encounter.    Current Outpatient Medications  Medication Sig Dispense Refill  . Buprenorphine HCl-Naloxone HCl (SUBOXONE) 8-2 MG FILM Place 1 Film under the tongue as needed.      Musculoskeletal: Strength & Muscle Tone: within normal limits Gait & Station: normal Patient leans: N/A  Psychiatric Specialty Exam: Physical Exam  Nursing note and vitals reviewed. Constitutional: He appears well-developed and well-nourished.  HENT:  Head: Normocephalic and atraumatic.  Eyes: Pupils are equal, round, and reactive to light. Conjunctivae are normal.  Neck: Normal range of motion.  Cardiovascular: Regular rhythm and normal heart sounds.  Respiratory:  Effort normal. No respiratory distress.  GI: Soft.  Musculoskeletal: Normal range of motion.  Neurological: He is alert.  Skin: Skin is warm and dry.  Psychiatric: His affect is blunt. His speech is delayed. He is slowed. Thought content is not paranoid. Cognition and memory are impaired. He expresses impulsivity. He expresses no homicidal and no suicidal ideation.    Review of Systems  Constitutional: Negative.   HENT: Negative.   Eyes: Negative.   Respiratory: Negative.   Cardiovascular: Negative.   Gastrointestinal: Negative.   Musculoskeletal: Negative.   Skin: Negative.   Neurological: Negative.   Psychiatric/Behavioral: Positive for depression and substance abuse. Negative for hallucinations, memory loss and suicidal ideas. The patient has insomnia. The patient is not nervous/anxious.     Blood pressure (!) 135/99, pulse 64, temperature 98.4 F (36.9 C), resp. rate 20, height 6' (1.829 m), weight 68 kg (150 lb), SpO2 100 %.Body mass index is 20.34 kg/m.  General Appearance: Disheveled  Eye Contact:  Fair  Speech:  Slow  Volume:  Decreased  Mood:  Dysphoric  Affect:  Blunt  Thought Process:  Coherent  Orientation:  Full (Time, Place, and Person)  Thought Content:  Tangential  Suicidal Thoughts:  No  Homicidal Thoughts:  No  Memory:  Immediate;   Fair Recent;   Fair Remote;   Fair  Judgement:  Fair  Insight:  Fair  Psychomotor Activity:  Decreased  Concentration:  Concentration: Fair  Recall:  [employer] of Knowledge:  Fair  Language:  Fair  Akathisia:  No  Handed:  Right  AIMS (if indicated):     Assets:  Desire for Improvement Housing Physical Health Resilience  ADL's:  Intact  Cognition:  WNL  Sleep:        Treatment Plan Summary: Plan 32 year old man came to the hospital with an elevated blood alcohol level.  The way he tells the story he had been drinking several hours before the police brought him in so presumably he had a much higher alcohol level  at some point.  Admits that he was having suicidal thoughts but did not actually do anything to harm himself.  Patient today is remorseful.  Denies any suicidal thought at all.  No evidence of psychosis.  He has symptoms which could be possibly consistent with depression although it is also possible that they could be consistent with alcohol abuse.  Patient will not be given any new prescriptions but he is strongly encouraged to get follow-up mental health treatment and will be given referrals to [facility].  Patient understands and agrees to plan.  Case reviewed with emergency room doctor.  Discontinue IVC.  Disposition: No evidence of imminent risk to self or others at present.   Patient does not meet criteria for psychiatric inpatient admission. Supportive therapy provided about ongoing stressors. Discussed crisis plan, support from social network, calling 911, coming to the Emergency Department, and calling Suicide Hotline.  Alethia Berthold, MD 02/01/2018 3:52 PM

## 2018-02-01 NOTE — BH Assessment (Signed)
Assessment Note  [name] is an 32 y.o. male. 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 Patient denies SI/HI/AVH. Patient was non-cooperative and poor historian during assessment. Per ED note "patient reports his girlfriend left him and he found out his dad has stopped cancer treatments." "Patient was found walking along the railroad tracks."   Patient endorses ETOH use.  Patient denies having outpatient mental health providers.  Patient currently has court dates for 02/01/2018, 02/15/2018, and 03/01/2018 for speeding, reckless driving to endanger and expired tags.   Diagnosis: Depression  Past Medical History:  Past Medical History:  Diagnosis Date  . Asthma     Past Surgical History:  Procedure Laterality Date  . TONSILLECTOMY      Family History: History reviewed. No pertinent family history.  Social History:  reports that he has never smoked. He has never used smokeless tobacco. He reports that he drinks about 7.8 oz of alcohol per week. He reports that he has current or past drug history. Drug: Cocaine.  Additional Social History:  Alcohol / Drug Use Pain Medications: SEE PTA  Prescriptions: SEE PTA  Over the Counter: SEE PTA  History of alcohol / drug use?: Yes Longest period of sobriety (when/how long): None reported  Negative Consequences of Use: Personal relationships, Work / Mining engineerchool Substance #1 Name of Substance 1: ETOH  1 - Age of First Use: None reported  1 - Amount (size/oz): None reported  1 - Frequency: None reported  1 - Duration: None reported  1 - Last Use / Amount: None reported   CIWA: CIWA-Ar BP: (!) 135/99 Pulse Rate: 64 COWS:    Allergies: No Known Allergies  Home Medications:  (Not in a hospital admission)  OB/GYN Status:  No LMP for male patient.  General Assessment Data Assessment unable to be completed: (Assessment completed) [facility] T[facility] Assessment: In system Is this a Tele  or Face-to-Face Assessment?: Face-to-Face Is this an Initial Assessment or a Re-assessment for this encounter?: Initial Assessment Marital status: Single Maiden name: N/A Is patient pregnant?: No Pregnancy Status: No Living Arrangements: Spouse/significant other Can pt return to current living arrangement?: Yes Admission Status: Involuntary Is patient capable of signing voluntary admission?: Yes Referral Source: Self/Family/Friend Insurance type: No insurance   Medical Screening Exam [facility] Walk-in ONLY) Medical Exam completed: Yes  Crisis Care Plan Living Arrangements: Spouse/significant other Legal Guardian: Other:(None reported) Name of Psychiatrist: None reported  Name of Therapist: None reported  Education Status Is patient currently in school?: No Is the patient employed, unemployed or receiving disability?: Unemployed  Risk to self with the past 6 months Suicidal Ideation: No Has patient been a risk to self within the past 6 months prior to admission? : No Suicidal Intent: No Has patient had any suicidal intent within the past 6 months prior to admission? : No Is patient at risk for suicide?: No Suicidal Plan?: No Has patient had any suicidal plan within the past 6 months prior to admission? : No Access to Means: No What has been your use of drugs/alcohol within the last 12 months?: ETOH  Previous Attempts/Gestures: No How many times?: 0 Other Self Harm Risks: Ongoing SA use Triggers for Past Attempts: Other (Comment)(None reported ) Intentional Self Injurious Behavior: None Family Suicide History: No Recent stressful life event(s): Other (Comment)(None reported) Persecutory voices/beliefs?: No Depression: Yes Depression Symptoms: Feeling worthless/self pity Substance abuse history and/or treatment for substance abuse?: Yes Suicide prevention information given to  non-admitted patients: Not applicable  Risk to Others within the past 6 months Homicidal Ideation:  No Does patient have any lifetime risk of violence toward others beyond the six months prior to admission? : No Thoughts of Harm to Others: No Current Homicidal Intent: No Current Homicidal Plan: No Access to Homicidal Means: No Identified Victim: None reported History of harm to others?: No Assessment of Violence: None Noted Violent Behavior Description: None reported Does patient have access to weapons?: No Criminal Charges Pending?: Yes Describe Pending Criminal Charges: expired registration tag(speeding, reckless driving to endanger, ) Does patient have a court date: Yes Court Date: 02/01/18(02/15/2018, 03/01/2018) Is patient on probation?: No  Psychosis Hallucinations: None noted Delusions: None noted  Mental Status Report Appearance/Hygiene: In scrubs Eye Contact: Poor Motor Activity: Unremarkable Speech: Slurred, Soft Level of Consciousness: Drowsy Mood: Preoccupied Affect: Blunted Anxiety Level: None Thought Processes: Circumstantial Judgement: Impaired Orientation: Person, Place, Time, Appropriate for developmental age, Situation Obsessive Compulsive Thoughts/Behaviors: None  Cognitive Functioning Concentration: Poor Memory: Remote Intact, Recent Intact Is patient IDD: No Is patient DD?: No Insight: Poor Impulse Control: Poor Appetite: Good Have you had any weight changes? : No Change Sleep: No Change Total Hours of Sleep: 8 Vegetative Symptoms: None  ADLScreening [facility]) Patient's cognitive ability adequate to safely complete daily activities?: Yes Patient able to express need for assistance with ADLs?: Yes Independently performs ADLs?: Yes (appropriate for developmental age)  Prior Inpatient Therapy Prior Inpatient Therapy: No  Prior Outpatient Therapy Prior Outpatient Therapy: No Does patient have an ACCT team?: No Does patient have Intensive In-House Services?  : No Does patient have [facility] services? : No Does patient have  P4CC services?: No  ADL Screening (condition at time of admission) Patient's cognitive ability adequate to safely complete daily activities?: Yes Is the patient deaf or have difficulty hearing?: No Does the patient have difficulty seeing, even when wearing glasses/contacts?: No Does the patient have difficulty concentrating, remembering, or making decisions?: No Patient able to express need for assistance with ADLs?: Yes Does the patient have difficulty dressing or bathing?: No Independently performs ADLs?: Yes (appropriate for developmental age) Does the patient have difficulty walking or climbing stairs?: No Weakness of Legs: None Weakness of Arms/Hands: None  Home Assistive Devices/Equipment Home Assistive Devices/Equipment: None  Therapy Consults (therapy consults require a physician order) PT Evaluation Needed: No OT Evalulation Needed: No SLP Evaluation Needed: No Abuse/Neglect Assessment (Assessment to be complete while patient is alone) Abuse/Neglect Assessment Can Be Completed: Yes Physical Abuse: Denies Verbal Abuse: Denies Sexual Abuse: Denies Exploitation of patient/patient's resources: Denies Self-Neglect: Denies Values / Beliefs Cultural Requests During Hospitalization: None Spiritual Requests During Hospitalization: None Consults Spiritual Care Consult Needed: No Social Work Consult Needed: No Merchant navy officer (For Healthcare) Does Patient Have a Medical Advance Directive?: No          Disposition:  Disposition Initial Assessment Completed for this Encounter: Yes Patient referred to: Other (Comment)(pending psych consult )  On Site Evaluation by:   Reviewed with Physician:    Rhea Bleacher Johniece Hornbaker, LPC, LCAS-A 02/01/2018 10:10 AM

## 2018-02-01 NOTE — ED Provider Notes (Signed)
Dr. Toni Amendlapacs has rescinded the patient's involuntary commitment and recommends outpatient management.   Merrily Brittleifenbark, Sakari Raisanen, MD 02/01/18 1546

## 2018-02-01 NOTE — ED Triage Notes (Addendum)
[place] [facility] after he was found walking in the road and alongside railroad tracks. Pt having suicidal ideation. New stressors in his life including his girlfriend leaving yesterday and pt also reports his Dad has cancer and wants to stop treatment. Tearful in triage. Pt told PD he wants help. No previous psych hx.

## 2018-02-01 NOTE — ED Notes (Signed)
Rescinded IVC by Dr. Toni Amendclapacs

## 2018-02-01 NOTE — ED Notes (Addendum)
Pt given breakfast tray

## 2018-02-01 NOTE — ED Notes (Signed)
[facility]

## 2018-02-01 NOTE — ED Notes (Signed)
Pt discharged to lobby with a friend. VS stable. Pt denies SI/HI. All belongings returned to patient. Discharge paperwork reviewed with patient.

## 2018-02-01 NOTE — BH Assessment (Signed)
This Clinical research associatewriter attempted assessment, pt refused to talk to or acknowledge this Clinical research associatewriter. Pt's NT came in to verbally and physically arouse pt and he continued to ignore stimuli. TTS will try again later.

## 2018-02-01 NOTE — ED Notes (Addendum)
Pt to be discharged today. Pt accepting. Maintained on 15 minute checks and observation by security camera for safety.

## 2019-01-27 IMAGING — DX DG HAND COMPLETE 3+V*L*
3 series · 3 of 3 positions shown · non-contrast
Comparison: None.

CLINICAL DATA: Trauma to the left hand today with pain.

EXAM:
LEFT HAND - COMPLETE 3+ VIEW

[hand ap]
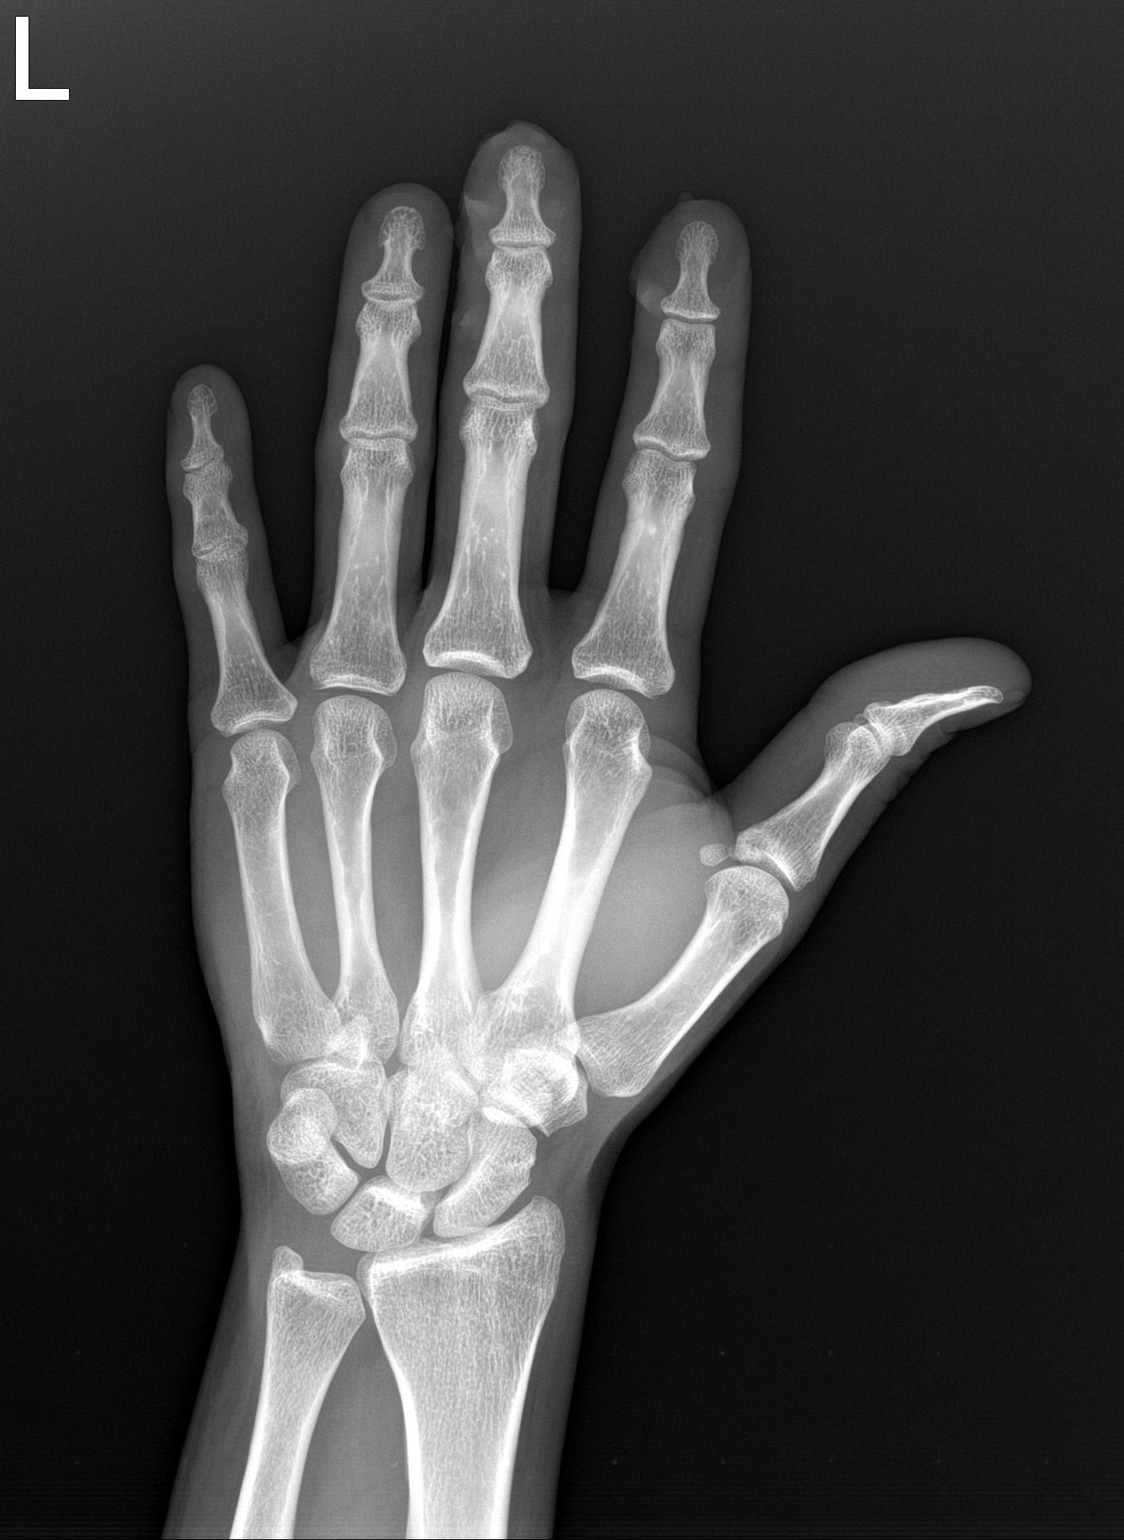

[hand obl]
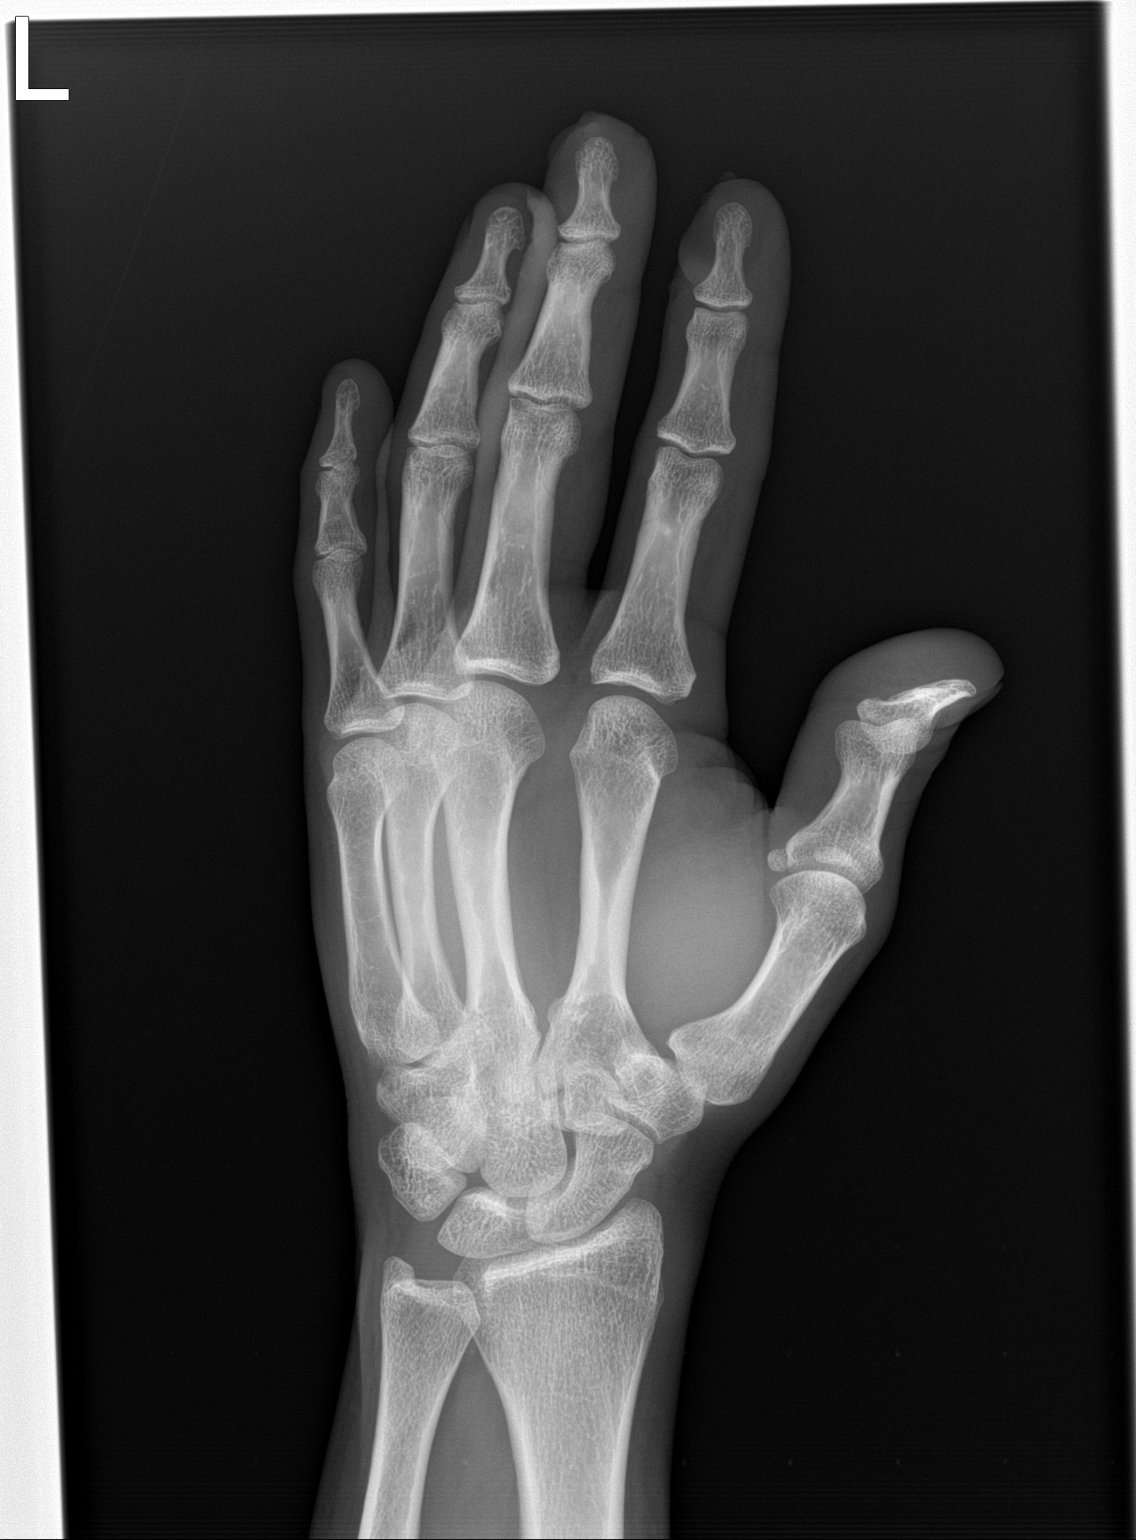

[hand lat]
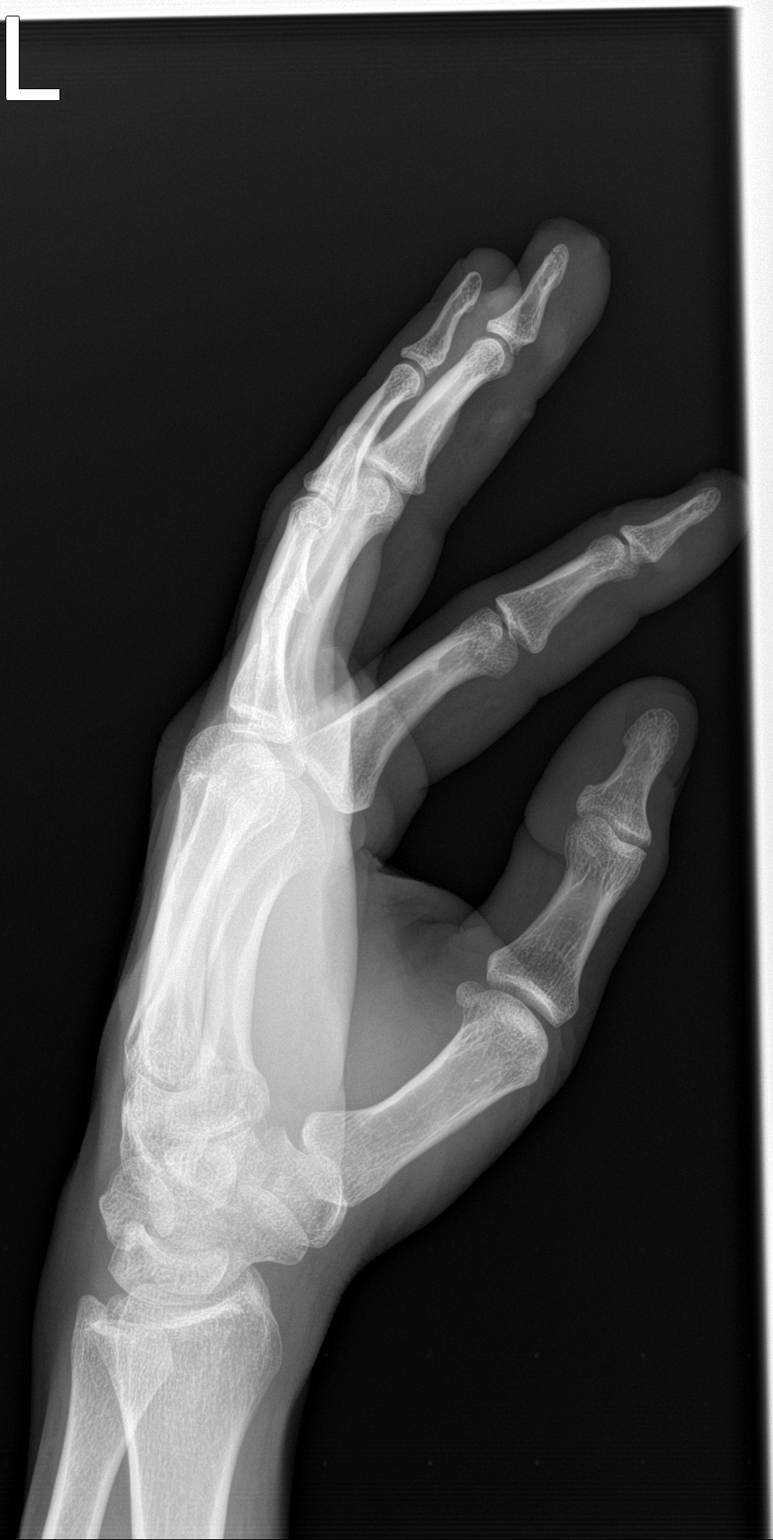

[3 of 3 positions shown; findings below may reference images not displayed]

FINDINGS: There is no evidence of fracture or dislocation. There is no
evidence of arthropathy or other focal bone abnormality. There is
patchy soft tissue density in the third digit consistent with known
soft tissue injury.
IMPRESSION: No acute fracture dislocation identified.

## 2019-08-19 ENCOUNTER — Emergency Department
Admission: EM | Admit: 2019-08-19 | Discharge: 2019-08-19 | Disposition: A | Discharge status: Home / Self Care | Payer: Self-pay | Attending: Emergency Medicine | Admitting: Emergency Medicine

## 2019-08-19 DIAGNOSIS — F10120 Alcohol abuse with intoxication, uncomplicated: Secondary | ICD-10-CM | POA: Insufficient documentation

## 2019-08-19 DIAGNOSIS — Z5321 Procedure and treatment not carried out due to patient leaving prior to being seen by health care provider: Secondary | ICD-10-CM | POA: Insufficient documentation

## 2019-08-19 DIAGNOSIS — Z6379 Other stressful life events affecting family and household: Secondary | ICD-10-CM | POA: Insufficient documentation

## 2019-08-19 DIAGNOSIS — F1410 Cocaine abuse, uncomplicated: Secondary | ICD-10-CM | POA: Insufficient documentation

## 2019-08-19 NOTE — ED Triage Notes (Signed)
Patient to ED via EMS for some domestic related issues. Lost custody of his children yesterday and he hasn't seen his wife since yesterday. States he has been drinking and used cocaine yesterday. Is not sure what he needs but comes here to seek help. Asked about detox and he says he doesn't know what he needs.

## 2019-08-19 NOTE — ED Notes (Signed)
In the middle of triaging the patient he has decided he is going to go home. This RN encouraged him to stay and speak with the ED physician but he states he shouldn't have come.

## 2019-08-19 NOTE — ED Notes (Signed)
Patient ambulatory to waiting room via EMS.  EMS reports called by PD due to patient appearing intoxicated.  EMS reports patient with increased stress.  HR 104, BP 132/88, pulse oxi 100% on room air

## 2020-05-20 ENCOUNTER — Ambulatory Visit: Payer: Self-pay

## 2020-06-12 ENCOUNTER — Ambulatory Visit: Payer: Self-pay | Admitting: Physician Assistant

## 2020-06-12 ENCOUNTER — Other Ambulatory Visit: Payer: Self-pay

## 2020-06-12 DIAGNOSIS — Z113 Encounter for screening for infections with a predominantly sexual mode of transmission: Secondary | ICD-10-CM

## 2020-06-12 DIAGNOSIS — N451 Epididymitis: Secondary | ICD-10-CM

## 2020-06-12 LAB — GRAM STAIN

## 2020-06-12 MED ORDER — DOXYCYCLINE HYCLATE 100 MG PO TABS: 100.0000 mg | ORAL_TABLET | Freq: Two times a day (BID) | ORAL | 0 refills | Status: AC

## 2020-06-12 MED ORDER — CEFTRIAXONE SODIUM 500 MG IJ SOLR
500.0000 mg | Freq: Once | INTRAMUSCULAR | Status: AC
  Administered 2020-06-12: 500 mg via INTRAMUSCULAR

## 2020-06-13 ENCOUNTER — Encounter: Payer: Self-pay | Admitting: Physician Assistant

## 2020-06-13 NOTE — Progress Notes (Signed)
[facility] Department STI clinic/screening visit  Subjective:  [name] is a 34 y.o. male being seen today for an STI screening visit. The patient reports they do have symptoms.    Patient has the following medical conditions:   Patient Active Problem List   Diagnosis Date Noted  . 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 Alcohol abuse 02/01/2018  . Substance induced mood disorder (HCC) 02/01/2018     Chief Complaint  Patient presents with  . SEXUALLY TRANSMITTED DISEASE    screening    HPI  Patient reports that his partner is a contact to [facility] and that he has had a "pulling" sensation in his testicles, they are sensitive and had difficulty with his urine stream for 1.5-2 weeks.  Denies other symptoms.  Reports that has a history of asthma.  Also, states that he was told by a plasma center where he had been donating that he might have Hep C but has not followed up about any further evaluation.  Reports that he has not had a HIV test in the past and last void prior to sample collection for Gram stain was over 2 hr ago.     See flowsheet for further details and programmatic requirements.    The following portions of the patient's history were reviewed and updated as appropriate: allergies, current medications, past medical history, past social history, past surgical history and problem list.  Objective:  There were no vitals filed for this visit.  Physical Exam Constitutional:      General: He is not in acute distress.    Appearance: Normal appearance.  HENT:     Head: Normocephalic and atraumatic.     Comments: No nits,lice, or hair loss. No cervical, supraclavicular or axillary adenopathy.    Mouth/Throat:     Mouth: Mucous membranes are moist.     Pharynx: Oropharynx is clear. No oropharyngeal exudate or posterior oropharyngeal erythema.  Eyes:     Conjunctiva/sclera: Conjunctivae normal.  Pulmonary:      Effort: Pulmonary effort is normal.  Abdominal:     Palpations: Abdomen is soft. There is no mass.     Tenderness: There is no abdominal tenderness. There is no guarding or rebound.  Genitourinary:    Penis: Normal.      Comments: Pubic area without nits, lice, hair loss, edema, erythema, lesions and inguinal adenopathy. Penis circumcised without rash, lesions and discharge at meatus. Bilaterally testicles are tender, without edema or erythema and masses. Musculoskeletal:     Cervical back: Neck supple. No tenderness.  Skin:    General: Skin is warm and dry.     Findings: No bruising, erythema, lesion or rash.  Neurological:     Mental Status: He is alert and oriented to person, place, and time.  Psychiatric:        Mood and Affect: Mood normal.        Behavior: Behavior normal.        Thought Content: Thought content normal.        Judgment: Judgment normal.       Assessment and Plan:  [name] is a 34 y.o. male presenting to the [facility] Department for STI screening  1. Screening for STD (sexually transmitted disease) Patient into clinic with symptoms. Rec condoms with all sex. Await test results.  Counseled that RN will call if needs to RTC for treatment once results are back. - Gram stain -  Gonococcus culture - HBV Antigen/Antibody [facility] - HIV/HCV [facility] - Syphilis Serology, [place]  2. Epididymitis Will treat for epididymitis and cover for GC and Chlamydia with Ceftriaxone 500 mg IM and Doxycycline 100 mg #28 1 po BID for 14 days. No sex for 21 days and until after partner completes treatment. Call with questions or concerns. Patient observed after injection for 15 minutes and released without incident. - cefTRIAXone (ROCEPHIN) injection 500 mg - doxycycline (VIBRA-TABS) 100 MG tablet; Take 1 tablet (100 mg total) by mouth 2 (two) times daily.  Dispense: 28 tablet; Refill: 0     No follow-ups on file.  No future  appointments.  Matt Holmes, PA

## 2020-06-17 LAB — GONOCOCCUS CULTURE

## 2020-06-21 NOTE — Progress Notes (Signed)
Chart reviewed by Pharmacist  Suzanne Walker PharmD, Contract Pharmacist at [facility]  

## 2020-06-24 ENCOUNTER — Telehealth: Payer: Self-pay

## 2020-06-24 DIAGNOSIS — K732 Chronic active hepatitis, not elsewhere classified: Secondary | ICD-10-CM | POA: Insufficient documentation

## 2020-06-24 LAB — HM HEPATITIS C SCREENING LAB: HM Hepatitis Screen: POSITIVE

## 2020-06-24 LAB — HM HIV SCREENING LAB: HM HIV Screening: NEGATIVE

## 2020-06-24 LAB — HEPATITIS B SURFACE ANTIGEN

## 2020-06-24 NOTE — Telephone Encounter (Signed)
TC to patient. Verified ID via password/SS#. Informed of positive Hep C and need for referral. Denies sx's of Hepatitis and agrees to referral to [facility] for eval and tx.Richmond Campbell, RN

## 2020-07-09 NOTE — Telephone Encounter (Signed)
Fax sent to [facility] re: +Hep C and patient would like appt for evaluation and treatment. Richmond Campbell, RN

## 2020-09-12 ENCOUNTER — Encounter: Payer: Self-pay | Admitting: Emergency Medicine

## 2020-09-12 ENCOUNTER — Other Ambulatory Visit: Payer: Self-pay

## 2020-09-12 ENCOUNTER — Emergency Department
Admission: EM | Admit: 2020-09-12 | Discharge: 2020-09-12 | Discharge status: Against Medical Advice | Payer: Self-pay | Attending: Emergency Medicine | Admitting: Emergency Medicine

## 2020-09-12 DIAGNOSIS — R079 Chest pain, unspecified: Secondary | ICD-10-CM | POA: Insufficient documentation

## 2020-09-12 DIAGNOSIS — F1490 Cocaine use, unspecified, uncomplicated: Secondary | ICD-10-CM | POA: Insufficient documentation

## 2020-09-12 DIAGNOSIS — Z5321 Procedure and treatment not carried out due to patient leaving prior to being seen by health care provider: Secondary | ICD-10-CM | POA: Insufficient documentation

## 2020-09-12 LAB — BASIC METABOLIC PANEL
Anion gap: 8 (ref 5–15)
BUN: 13 mg/dL (ref 6–20)
CO2: 28 mmol/L (ref 22–32)
Calcium: 8.8 mg/dL — ABNORMAL LOW (ref 8.9–10.3)
Chloride: 102 mmol/L (ref 98–111)
Creatinine, Ser: 1.03 mg/dL (ref 0.61–1.24)
GFR, Estimated: >60 mL/min (ref >60)
Glucose, Bld: 89 mg/dL (ref 70–99)
Potassium: 3.5 mmol/L (ref 3.5–5.1)
Sodium: 138 mmol/L (ref 135–145)

## 2020-09-12 LAB — CBC
HCT: 44.5 % (ref 39.0–52.0)
Hemoglobin: 15.2 g/dL (ref 13.0–17.0)
MCH: 31.5 pg (ref 26.0–34.0)
MCHC: 34.2 g/dL (ref 30.0–36.0)
MCV: 92.1 fL (ref 80.0–100.0)
Platelets: 303 10*3/uL (ref 150–400)
RBC: 4.83 MIL/uL (ref 4.22–5.81)
RDW: 12.9 % (ref 11.5–15.5)
WBC: 8.3 10*3/uL (ref 4.0–10.5)
nRBC: 0 % (ref 0.0–0.2)

## 2020-09-12 LAB — TROPONIN I (HIGH SENSITIVITY): Troponin I (High Sensitivity): <2 ng/L (ref <18)

## 2020-09-12 NOTE — ED Notes (Signed)
Pt leaving AMA. 

## 2020-09-12 NOTE — ED Notes (Signed)
Ed Veterinary surgeon attempted to secure vital signs on pt. Per eman, pt stated he was leaving. Pt ambulatory outside on phone in no acute distress.

## 2020-09-12 NOTE — ED Triage Notes (Signed)
Pt arrived via EMS from firehouse where pt walked after using cocaine approx 45 minutes ago and developed central, non-radiating chest pain 10 minutes after using. Pt denies SOB and sts, "Im starting to feel better." Pt reports using cocaine x2 today and regularly;y uses.

## 2024-01-19 ENCOUNTER — Emergency Department
Admission: EM | Admit: 2024-01-19 | Discharge: 2024-01-19 | Disposition: A | Discharge status: Home / Self Care | Attending: Emergency Medicine | Admitting: Emergency Medicine

## 2024-01-19 ENCOUNTER — Emergency Department

## 2024-01-19 ENCOUNTER — Encounter: Payer: Self-pay | Admitting: Emergency Medicine

## 2024-01-19 ENCOUNTER — Other Ambulatory Visit: Payer: Self-pay

## 2024-01-19 DIAGNOSIS — F1410 Cocaine abuse, uncomplicated: Secondary | ICD-10-CM | POA: Insufficient documentation

## 2024-01-19 DIAGNOSIS — J181 Lobar pneumonia, unspecified organism: Secondary | ICD-10-CM | POA: Diagnosis not present

## 2024-01-19 DIAGNOSIS — R918 Other nonspecific abnormal finding of lung field: Secondary | ICD-10-CM | POA: Diagnosis not present

## 2024-01-19 DIAGNOSIS — R079 Chest pain, unspecified: Secondary | ICD-10-CM | POA: Diagnosis not present

## 2024-01-19 DIAGNOSIS — K5790 Diverticulosis of intestine, part unspecified, without perforation or abscess without bleeding: Secondary | ICD-10-CM | POA: Diagnosis not present

## 2024-01-19 DIAGNOSIS — X58XXXA Exposure to other specified factors, initial encounter: Secondary | ICD-10-CM | POA: Insufficient documentation

## 2024-01-19 DIAGNOSIS — R072 Precordial pain: Secondary | ICD-10-CM | POA: Insufficient documentation

## 2024-01-19 DIAGNOSIS — S22058A Other fracture of T5-T6 vertebra, initial encounter for closed fracture: Secondary | ICD-10-CM | POA: Insufficient documentation

## 2024-01-19 DIAGNOSIS — R0789 Other chest pain: Secondary | ICD-10-CM | POA: Diagnosis not present

## 2024-01-19 DIAGNOSIS — S22009A Unspecified fracture of unspecified thoracic vertebra, initial encounter for closed fracture: Secondary | ICD-10-CM

## 2024-01-19 DIAGNOSIS — J984 Other disorders of lung: Secondary | ICD-10-CM | POA: Diagnosis not present

## 2024-01-19 DIAGNOSIS — J45909 Unspecified asthma, uncomplicated: Secondary | ICD-10-CM | POA: Insufficient documentation

## 2024-01-19 DIAGNOSIS — J189 Pneumonia, unspecified organism: Secondary | ICD-10-CM

## 2024-01-19 DIAGNOSIS — S22048A Other fracture of fourth thoracic vertebra, initial encounter for closed fracture: Secondary | ICD-10-CM | POA: Insufficient documentation

## 2024-01-19 DIAGNOSIS — F1490 Cocaine use, unspecified, uncomplicated: Secondary | ICD-10-CM

## 2024-01-19 DIAGNOSIS — K5900 Constipation, unspecified: Secondary | ICD-10-CM | POA: Diagnosis not present

## 2024-01-19 DIAGNOSIS — R000 Tachycardia, unspecified: Secondary | ICD-10-CM | POA: Diagnosis not present

## 2024-01-19 LAB — COMPREHENSIVE METABOLIC PANEL WITH GFR
ALT: 11 U/L (ref 0–44)
AST: 15 U/L (ref 15–41)
Albumin: 4 g/dL (ref 3.5–5.0)
Alkaline Phosphatase: 50 U/L (ref 38–126)
Anion gap: 8 (ref 5–15)
BUN: 25 mg/dL — ABNORMAL HIGH (ref 6–20)
CO2: 28 mmol/L (ref 22–32)
Calcium: 8.8 mg/dL — ABNORMAL LOW (ref 8.9–10.3)
Chloride: 107 mmol/L (ref 98–111)
Creatinine, Ser: 1.28 mg/dL — ABNORMAL HIGH (ref 0.61–1.24)
GFR, Estimated: >60 mL/min (ref >60)
Glucose, Bld: 102 mg/dL — ABNORMAL HIGH (ref 70–99)
Potassium: 3.8 mmol/L (ref 3.5–5.1)
Sodium: 143 mmol/L (ref 135–145)
Total Bilirubin: 0.8 mg/dL (ref 0.0–1.2)
Total Protein: 7.2 g/dL (ref 6.5–8.1)

## 2024-01-19 LAB — CBC WITH DIFFERENTIAL/PLATELET
Abs Immature Granulocytes: 0.03 10*3/uL (ref 0.00–0.07)
Basophils Absolute: 0.1 10*3/uL (ref 0.0–0.1)
Basophils Relative: 1 %
Eosinophils Absolute: 0.1 10*3/uL (ref 0.0–0.5)
Eosinophils Relative: 2 %
HCT: 48.6 % (ref 39.0–52.0)
Hemoglobin: 16 g/dL (ref 13.0–17.0)
Immature Granulocytes: 0 %
Lymphocytes Relative: 16 %
Lymphs Abs: 1.4 10*3/uL (ref 0.7–4.0)
MCH: 30.9 pg (ref 26.0–34.0)
MCHC: 32.9 g/dL (ref 30.0–36.0)
MCV: 93.8 fL (ref 80.0–100.0)
Monocytes Absolute: 0.7 10*3/uL (ref 0.1–1.0)
Monocytes Relative: 8 %
Neutro Abs: 6.5 10*3/uL (ref 1.7–7.7)
Neutrophils Relative %: 73 %
Platelets: 315 10*3/uL (ref 150–400)
RBC: 5.18 MIL/uL (ref 4.22–5.81)
RDW: 12.9 % (ref 11.5–15.5)
WBC: 8.8 10*3/uL (ref 4.0–10.5)
nRBC: 0 % (ref 0.0–0.2)

## 2024-01-19 LAB — TROPONIN I (HIGH SENSITIVITY)
Troponin I (High Sensitivity): 4 ng/L (ref <18)
Troponin I (High Sensitivity): <2 ng/L (ref <18)

## 2024-01-19 LAB — LIPASE, BLOOD: Lipase: 30 U/L (ref 11–51)

## 2024-01-19 MED ORDER — AMOXICILLIN-POT CLAVULANATE 875-125 MG PO TABS: 1.0000 | ORAL_TABLET | Freq: Two times a day (BID) | ORAL | 0 refills | Status: AC

## 2024-01-19 MED ORDER — IOHEXOL 350 MG/ML SOLN
100.0000 mL | Freq: Once | INTRAVENOUS | Status: AC | PRN
  Administered 2024-01-19: 100 mL via INTRAVENOUS

## 2024-01-19 MED ORDER — AMOXICILLIN-POT CLAVULANATE 875-125 MG PO TABS
1.0000 | ORAL_TABLET | Freq: Once | ORAL | Status: AC
  Administered 2024-01-19: 1 via ORAL
  Filled 2024-01-19: qty 1

## 2024-01-19 MED ORDER — MORPHINE SULFATE (PF) 4 MG/ML IV SOLN
4.0000 mg | Freq: Once | INTRAVENOUS | Status: AC
  Administered 2024-01-19: 4 mg via INTRAVENOUS
  Filled 2024-01-19: qty 1

## 2024-01-19 NOTE — Discharge Instructions (Addendum)
 Fortunately your evaluation in the emergency department did not show any emergent conditions like heart attack or tears in the major blood vessel in your chest to account for your chest pain tonight.  You have what is likely an early lung infection for which you should take the full for 7-day course of antibiotics I have prescribed for you.  You have some old appearing thoracic spine fractures for which you should call Dr. Katrina who is a spinal specialist for an outpatient appointment.  Take acetaminophen  650 mg and ibuprofen 400 mg every 6 hours for pain.  Take with food.   Try your best to stop using crack cocaine as this is bad for your health and increases your chances of bad health outcomes like heart attacks.  Thank you for choosing us  for your health care today!  Please see your primary doctor this week for a follow up appointment.   If you have any new, worsening, or unexpected symptoms call your doctor right away or come back to the emergency department for reevaluation.  It was my pleasure to care for you today.   Ginnie EDISON Cyrena, MD

## 2024-01-19 NOTE — ED Triage Notes (Addendum)
 To ER from home via EMS for report of chest pain that began yesterday morning, woke from sleep. Subsided during the day, then woke patient tonight with pain between shoulder blades and hurting to take a deep breath.  324mg  of ASA given by EMS.  History of cocaine use, yesterday morning.

## 2024-01-19 NOTE — ED Provider Notes (Addendum)
 [facility] Provider Note    Event Date/Time   First MD Initiated Contact with Patient 01/19/24 [phone]     (approximate)   History   Chest Pain   HPI  [name] is a 38 y.o. male   Past medical history of asthma and substance use who presents to the emergency department with chest pain over the last 24 hours 9 out of 10 substernal radiating to his back in the setting of using crack cocaine.  He uses crack cocaine often has been a user for the last 20 years.  He denies other drug use or alcohol use.  He denies any obvious inciting event and the chest pain started after using crack in the morning.  He denies any GI symptoms or respiratory infectious symptoms.  Independent Historian contributed to assessment above: EMS gives report similar to the above, cardiac rhythm strip done in the field without any ischemic changes, noted to have given the patient aspirin 324 mg prior to arrival       Physical Exam   Triage Vital Signs: ED Triage Vitals  Encounter Vitals Group     BP 01/19/24 0259 (!) 141/87     Girls Systolic BP Percentile --      Girls Diastolic BP Percentile --      Boys Systolic BP Percentile --      Boys Diastolic BP Percentile --      Pulse Rate 01/19/24 0259 66     Resp 01/19/24 0259 18     Temp 01/19/24 0259 98.3 F (36.8 C)     Temp Source 01/19/24 0259 Oral     SpO2 01/19/24 0259 98 %     Weight 01/19/24 0258 149 lb 14.6 oz (68 kg)     Height 01/19/24 0258 6' (1.829 m)     Head Circumference --      Peak Flow --      Pain Score 01/19/24 0256 10     Pain Loc --      Pain Education --      Exclude from Growth Chart --     Most recent vital signs: Vitals:   01/19/24 0259  BP: (!) 141/87  Pulse: 66  Resp: 18  Temp: 98.3 F (36.8 C)  SpO2: 98%    General: Awake, no distress.  CV:  Good peripheral perfusion.  Resp:  Normal effort.  Abd:  No distention.  Other:  He looks uncomfortable, palpation of the chest  wall elicits some pain but there is no crepitus.  Auscultation of the lungs reveals no focalities, wheezing or rales.  Abdomen is soft and nontender deep palpation all quadrants.  I am able to palpate and equal radial pulse in both wrists.   ED Results / Procedures / Treatments   Labs (all labs ordered are listed, but only abnormal results are displayed) Labs Reviewed  COMPREHENSIVE METABOLIC PANEL WITH GFR - Abnormal; Notable for the following components:      Result Value   Glucose, Bld 102 (*)    BUN 25 (*)    Creatinine, Ser 1.28 (*)    Calcium 8.8 (*)    All other components within normal limits  CBC WITH DIFFERENTIAL/PLATELET  LIPASE, BLOOD  TROPONIN I (HIGH SENSITIVITY)  TROPONIN I (HIGH SENSITIVITY)     I ordered and reviewed the above labs they are notable for initial troponin negative  EKG  ED ECG REPORT I, Ginnie Shams, the attending physician, personally viewed and  interpreted this ECG.   Date: 01/19/2024  EKG Time: 0301  Rate: 67  Rhythm: sinus  Axis: nl  Intervals:nl  ST&T Change: no stemi    RADIOLOGY I independently reviewed and interpreted CT angiogram of chest abdomen pelvis see no obvious large dissection flap I also reviewed radiologist's formal read.   PROCEDURES:  Critical Care performed: No  Procedures   MEDICATIONS ORDERED IN ED: Medications  amoxicillin-clavulanate (AUGMENTIN) 875-125 MG per tablet 1 tablet (has no administration in time range)  morphine (PF) 4 MG/ML injection 4 mg (4 mg Intravenous Given 01/19/24 0304)  iohexol (OMNIPAQUE) 350 MG/ML injection 100 mL (100 mLs Intravenous Contrast Given 01/19/24 0403)     IMPRESSION / MDM / ASSESSMENT AND PLAN / ED COURSE  I reviewed the triage vital signs and the nursing notes.                                Patient's presentation is most consistent with acute presentation with potential threat to life or bodily function.  Differential diagnosis includes, but is not limited to,  cocaine induced chest pain, ACS, dissection, respiratory infection, musculoskeletal pain/costochondritis, pericarditis   The patient is on the cardiac monitor to evaluate for evidence of arrhythmia and/or significant heart rate changes.  MDM:    Young man with risk factors including extensive crack cocaine use here with chest pain rating to the back concerning for ACS or dissection, will proceed with EKG, serial troponins and a CT angiogram dissection protocol.  Will give morphine for pain.   Initial troponin negative, patient resting comfortably after pain medications, CT angiogram completed and awaiting final read.     -- CT angiogram negative for dissection but does show sign of a early left lower lobe pneumonia for which I given the first dose of Augmentin in the emergency department a prescription for the same.  He is resting comfortably with no leukocytosis nor vital sign abnormalities so this is not consistent with sepsis.  He is breathing comfortably with good oxygen saturation on room air.  CT also shows what appears to be subacute T-spine fractures which may account for some of his radiation to the back.  He has no red flag symptoms like motor or sensory changes, incontinence, to suggest cord compromise and so I think this finding can be followed up as an outpatient with spinal specialist Dr. Katrina and I have given contact information in his discharge paperwork   I considered hospitalization/admission/observation however given his stability in the emergency department, early lung infection identified on imaging along with no signs of sepsis or respiratory distress currently, and a subacute T-spine fracture with no evidence of cord compromise, I think he can be followed up as an outpatient with spine and prescribed an outpatient course of antibiotics for outpatient treatment of pneumonia.      FINAL CLINICAL IMPRESSION(S) / ED DIAGNOSES   Final diagnoses:  Nonspecific chest  pain  Crack cocaine use  Closed fracture of thoracic vertebra, unspecified fracture morphology, unspecified thoracic vertebral level, initial encounter (HCC)  Pneumonia of left lower lobe due to infectious organism     Rx / DC Orders   ED Discharge Orders          Ordered    Ambulatory Referral to Primary Care (Establish Care)        01/19/24 0449    amoxicillin-clavulanate (AUGMENTIN) 875-125 MG tablet  2 times daily  01/19/24 0454             Note:  This document was prepared using Dragon voice recognition software and may include unintentional dictation errors.    Cyrena Mylar, MD 01/19/24 9542    Cyrena Mylar, MD 01/19/24 0500

## 2024-05-24 ENCOUNTER — Ambulatory Visit: Admitting: Pediatrics

## 2024-06-04 ENCOUNTER — Emergency Department
Admission: EM | Admit: 2024-06-04 | Discharge: 2024-06-04 | Disposition: A | Discharge status: Home / Self Care | Attending: Emergency Medicine | Admitting: Emergency Medicine

## 2024-06-04 ENCOUNTER — Other Ambulatory Visit: Payer: Self-pay

## 2024-06-04 DIAGNOSIS — R000 Tachycardia, unspecified: Secondary | ICD-10-CM | POA: Diagnosis not present

## 2024-06-04 DIAGNOSIS — T50902A Poisoning by unspecified drugs, medicaments and biological substances, intentional self-harm, initial encounter: Secondary | ICD-10-CM

## 2024-06-04 DIAGNOSIS — T405X2A Poisoning by cocaine, intentional self-harm, initial encounter: Secondary | ICD-10-CM | POA: Diagnosis not present

## 2024-06-04 LAB — COMPREHENSIVE METABOLIC PANEL WITH GFR
ALT: 10 U/L (ref 0–44)
AST: 12 U/L — ABNORMAL LOW (ref 15–41)
Albumin: 4.5 g/dL (ref 3.5–5.0)
Alkaline Phosphatase: 43 U/L (ref 38–126)
Anion gap: 8 (ref 5–15)
BUN: 21 mg/dL — ABNORMAL HIGH (ref 6–20)
CO2: 26 mmol/L (ref 22–32)
Calcium: 8.8 mg/dL — ABNORMAL LOW (ref 8.9–10.3)
Chloride: 106 mmol/L (ref 98–111)
Creatinine, Ser: 0.97 mg/dL (ref 0.61–1.24)
GFR, Estimated: >60 mL/min (ref >60)
Glucose, Bld: 102 mg/dL — ABNORMAL HIGH (ref 70–99)
Potassium: 3.8 mmol/L (ref 3.5–5.1)
Sodium: 140 mmol/L (ref 135–145)
Total Bilirubin: 0.5 mg/dL (ref 0.0–1.2)
Total Protein: 7.5 g/dL (ref 6.5–8.1)

## 2024-06-04 LAB — CBC WITH DIFFERENTIAL/PLATELET
Abs Immature Granulocytes: 0.02 K/uL (ref 0.00–0.07)
Basophils Absolute: 0.1 K/uL (ref 0.0–0.1)
Basophils Relative: 1 %
Eosinophils Absolute: 0.1 K/uL (ref 0.0–0.5)
Eosinophils Relative: 1 %
HCT: 43.3 % (ref 39.0–52.0)
Hemoglobin: 14.4 g/dL (ref 13.0–17.0)
Immature Granulocytes: 0 %
Lymphocytes Relative: 16 %
Lymphs Abs: 1.3 K/uL (ref 0.7–4.0)
MCH: 31.2 pg (ref 26.0–34.0)
MCHC: 33.3 g/dL (ref 30.0–36.0)
MCV: 93.7 fL (ref 80.0–100.0)
Monocytes Absolute: 0.5 K/uL (ref 0.1–1.0)
Monocytes Relative: 6 %
Neutro Abs: 6.1 K/uL (ref 1.7–7.7)
Neutrophils Relative %: 76 %
Platelets: 257 K/uL (ref 150–400)
RBC: 4.62 MIL/uL (ref 4.22–5.81)
RDW: 12.9 % (ref 11.5–15.5)
WBC: 8 K/uL (ref 4.0–10.5)
nRBC: 0 % (ref 0.0–0.2)

## 2024-06-04 NOTE — ED Notes (Signed)
 Per Poison Control protocol is to cardiac monitor and hold for obs for 12 hours. If pt still not showing S&S pt can be discharged after repeat EKG.

## 2024-06-04 NOTE — ED Notes (Signed)
 Called Poison control spoke with Sonny, RN and stated that it was safe to D/C to [facility].

## 2024-06-04 NOTE — ED Notes (Signed)
 Blood work sent to lab.

## 2024-06-04 NOTE — Discharge Instructions (Signed)
 Cleared by poison control for discharge after 12 hours of monitor and normal EKG.  Return to ER for any other concerns.

## 2024-06-04 NOTE — ED Notes (Signed)
 [name] from [facility] Control notified of correct time of ingestion (10:30pm last night).

## 2024-06-04 NOTE — ED Triage Notes (Signed)
 Pt to ED via [facility] for med clearance, pt reports he swallowed a bag or crack tonight, aprox 0.5g. Pt denies pain or discomfort.

## 2024-06-04 NOTE — ED Provider Notes (Signed)
 [facility] Provider Note    Event Date/Time   First MD Initiated Contact with Patient 06/04/24 0111     (approximate)   History   Medical Clearance   HPI  [name] is a 38 year old male presenting to the ER for evaluation of drug ingestion.  Patient arrives in police custody.  He reports that he was smoking crack throughout the day.  Reports that around the time he was getting arrested he swallowed a bag of crack that he thinks had about half a gram of it in it.  Reports that it would not be typical for him to smoke about 3 g in a day.  This occurred around 10:30 PM.  He denies any complaints associated with this.  Specifically denies chest pain, shortness of breath, lightheadedness, abdominal pain, nausea, vomiting. Denies intent of self-harm.      Physical Exam   Triage Vital Signs: ED Triage Vitals [06/04/24 0100]  Encounter Vitals Group     BP (!) 138/95     Girls Systolic BP Percentile      Girls Diastolic BP Percentile      Boys Systolic BP Percentile      Boys Diastolic BP Percentile      Pulse Rate 78     Resp 18     Temp 97.9 F (36.6 C)     Temp src      SpO2 97 %     Weight 150 lb (68 kg)     Height 6' (1.829 m)     Head Circumference      Peak Flow      Pain Score 0     Pain Loc      Pain Education      Exclude from Growth Chart     Most recent vital signs: Vitals:   06/04/24 0430 06/04/24 0545  BP:    Pulse:  63  Resp:  16  Temp: 98 F (36.7 C)   SpO2:  98%     General: Awake, interactive  CV:  Good peripheral perfusion Resp:  Unlabored respirations Abd:  Nondistended.  Neuro:  Symmetric facial movement, fluid speech   ED Results / Procedures / Treatments   Labs (all labs ordered are listed, but only abnormal results are displayed) Labs Reviewed  COMPREHENSIVE METABOLIC PANEL WITH GFR - Abnormal; Notable for the following components:      Result Value   Glucose, Bld 102 (*)    BUN 21 (*)     Calcium 8.8 (*)    AST 12 (*)    All other components within normal limits  CBC WITH DIFFERENTIAL/PLATELET     EKG EKG independently reviewed and interpreted by myself demonstrates:    RADIOLOGY Imaging independently reviewed and interpreted by myself demonstrates:   Formal Radiology Read:  No results found.  PROCEDURES:  Critical Care performed: No  Procedures   MEDICATIONS ORDERED IN ED: Medications - No data to display   IMPRESSION / MDM / ASSESSMENT AND PLAN / ED COURSE  I reviewed the triage vital signs and the nursing notes.  Differential diagnosis includes, but is not limited to, drug intoxication or withdrawal, sealed bag without absorption from ingestion, arrhythmia  Patient's presentation is most consistent with acute presentation with potential threat to life or bodily function.  38 year old male presenting to the ER with swallowed bag of crack.  Symptomatic on presentation.  Reviewed with Kevin with poison control: -Recommends 8 hours of cardiac monitoring -  Narcan as indicated, can use fluids and benzos for agitation -Recommend twelve-lead EKG prior to discharge -CBC and CMP for screening patient does develop symptoms  -If develops symptoms, admit for 24 hour obs  -Consider charcoal/golytely if patient agreeable   Discussed activated charcoal and GoLytely with patient.  Would prefer to hold off.  In obtaining additional history, it actually appears that ingestion was several hours ago so I do think this is reasonable.  Will plan for 8 hours of observation from time of ingestion which is around 6:30AM.  Clinical Course as of 06/04/24 0710  Tue Jun 04, 2024  0521 Patient reassessed. Continued to deny any cardiorespiratory or other symptoms. Will obtain repeat EKG.  [NR]  0640 Repeat EKG demonstrates sinus rhythm at rate of 60, PR 112, QRS 95, QTc 406, no acute ST changes.  Initial plan for discharge at 6:30 AM, 8 hours after initial ingestion.  However,  received update from poison control that they actually recommend a 12-hour observation on cardiac monitoring with now recommended discharge time of 10:30 AM, report that initial recommendation of 8 hours was inaccurate. Report consideration of rectal exam if patient agrees. If patient develops symptoms recommend admission for 24-hour observation. Patient updated. Remains symptom free. Declines rectal exam. [NR]  0710 Signed out to oncoming physician pending additional observation, discharge if remains asymptomatic at 1030.  If patient develops symptoms, supportive care and will likely require admission for more prolonged 24-hour observation at that point. [NR]    Clinical Course User Index [NR] Levander Slate, MD     FINAL CLINICAL IMPRESSION(S) / ED DIAGNOSES   Final diagnoses:  Purposeful non-suicidal drug ingestion, initial encounter [facility])     Rx / DC Orders   ED Discharge Orders     None        Note:  This document was prepared using Dragon voice recognition software and may include unintentional dictation errors.   Levander Slate, MD 06/04/24 [phone]

## 2024-06-04 NOTE — ED Provider Notes (Signed)
 7:12 AM Assumed care for off going team.   Blood pressure 114/66, pulse 63, temperature 98 F (36.7 C), temperature source Oral, resp. rate 16, height 6' (1.829 m), weight 68 kg, SpO2 98%.  See their HPI for full report but in brief pending EKG and re-assess symptoms around 1030  Sinus tachycardia rate of 102 without any ST elevation intervals  10:50 AM  When I went to go reevaluate patient he was resting comfortably in bed therefore I did get a another EKG where patient was resting where an EKG was sinus rate of 69 without any ST elevations or T wave inversions, normal intervals.  Patient denies any symptoms of chest pain, shortness of breath, palpitations.  I will call poison control to ensure patient is cleared for discharge home.   Pt cleared by poison control       Ernest Ronal BRAVO, MD 06/04/24 1113

## 2024-06-04 NOTE — ED Notes (Signed)
 [facility] called to initiate cardiac monitoring.
